# Patient Record
Sex: Male | Born: 1945 | Race: White | Hispanic: No | Smoking: Never smoker
Health system: Southern US, Community
[De-identification: ages and names within clinical notes are randomized; demographics above are authoritative.]

## PROBLEM LIST (undated history)

## (undated) DIAGNOSIS — Z87442 Personal history of urinary calculi: Secondary | ICD-10-CM

## (undated) DIAGNOSIS — R972 Elevated prostate specific antigen [PSA]: Secondary | ICD-10-CM

## (undated) DIAGNOSIS — N529 Male erectile dysfunction, unspecified: Secondary | ICD-10-CM

## (undated) DIAGNOSIS — Z86010 Personal history of colonic polyps: Secondary | ICD-10-CM

## (undated) DIAGNOSIS — M1990 Unspecified osteoarthritis, unspecified site: Secondary | ICD-10-CM

## (undated) DIAGNOSIS — N400 Enlarged prostate without lower urinary tract symptoms: Secondary | ICD-10-CM

## (undated) DIAGNOSIS — Z87448 Personal history of other diseases of urinary system: Secondary | ICD-10-CM

## (undated) DIAGNOSIS — Z8619 Personal history of other infectious and parasitic diseases: Secondary | ICD-10-CM

## (undated) DIAGNOSIS — R399 Unspecified symptoms and signs involving the genitourinary system: Secondary | ICD-10-CM

## (undated) DIAGNOSIS — N486 Induration penis plastica: Secondary | ICD-10-CM

## (undated) DIAGNOSIS — N201 Calculus of ureter: Secondary | ICD-10-CM

## (undated) DIAGNOSIS — Z860101 Personal history of adenomatous and serrated colon polyps: Secondary | ICD-10-CM

## (undated) DIAGNOSIS — N200 Calculus of kidney: Secondary | ICD-10-CM

## (undated) DIAGNOSIS — E291 Testicular hypofunction: Secondary | ICD-10-CM

## (undated) DIAGNOSIS — R7303 Prediabetes: Secondary | ICD-10-CM

## (undated) HISTORY — PX: CATARACT EXTRACTION: SUR2

## (undated) HISTORY — PX: TONSILLECTOMY: SUR1361

## (undated) HISTORY — PX: KIDNEY STONE SURGERY: SHX686

---

## 1999-04-16 HISTORY — PX: INGUINAL HERNIA REPAIR: SUR1180

## 2003-04-16 HISTORY — PX: KNEE ARTHROSCOPY: SUR90

## 2003-05-02 ENCOUNTER — Ambulatory Visit (HOSPITAL_COMMUNITY): Admission: RE | Admit: 2003-05-02 | Discharge: 2003-05-02 | Payer: Self-pay | Admitting: General Surgery

## 2003-10-24 ENCOUNTER — Encounter (INDEPENDENT_AMBULATORY_CARE_PROVIDER_SITE_OTHER): Payer: Self-pay | Admitting: *Deleted

## 2003-10-24 ENCOUNTER — Ambulatory Visit (HOSPITAL_COMMUNITY): Admission: RE | Admit: 2003-10-24 | Discharge: 2003-10-24 | Payer: Self-pay | Admitting: Gastroenterology

## 2008-03-21 DIAGNOSIS — M150 Primary generalized (osteo)arthritis: Secondary | ICD-10-CM | POA: Insufficient documentation

## 2009-01-31 ENCOUNTER — Encounter: Admission: RE | Admit: 2009-01-31 | Discharge: 2009-01-31 | Payer: Self-pay | Admitting: Family Medicine

## 2009-03-16 ENCOUNTER — Ambulatory Visit (HOSPITAL_COMMUNITY): Admission: RE | Admit: 2009-03-16 | Discharge: 2009-03-16 | Payer: Self-pay | Admitting: Gastroenterology

## 2010-08-31 NOTE — Op Note (Signed)
NAME:  Jesus [name], Jesus [name]                           ACCOUNT NO.:  [id number]   MEDICAL RECORD NO.:  [id number]                   PATIENT TYPE:  AMB   LOCATION:  DAY                                  FACILITY:  [facility]   PHYSICIAN:  Valetta Fuller, M.D.               DATE OF BIRTH:  Jan 13, 1946   DATE OF PROCEDURE:  05/02/2003  DATE OF DISCHARGE:                                 OPERATIVE REPORT   PREOPERATIVE DIAGNOSIS:  Peyronie's disease.   POSTOPERATIVE DIAGNOSIS:  Peyronie's disease.   OPERATION/PROCEDURE:  Nesbit plication.   SURGEON:  Valetta Fuller, M.D.   ASSISTANT:  Susanne Borders, M.D.   ANESTHESIA:  General endotracheal anesthesia, penile block.   COMPLICATIONS:  None apparent.   DISPOSITION:  To the PACU in stable condition.   BRIEF INDICATIONS FOR PROCEDURE:  Jesus [name] is a 65 year old male who has  been seen by Dr. Isabel Caprice for his Peyronie's disease.  The patient had a  complaint of curvature which made it difficult for intercourse.  The patient  underwent a prostate gland injection in the office which demonstrated a 90-  degree curvature towards the left and a 30-40% dorsal curvature at about the  mid shaft level.  The patient was told that this problem could be repaired  surgically and he agreed to a Nesbit plication straightening procedure after  understanding the risks, benefits and alternatives.  The patient also had a  right inguinal hernia for which he had seen Dr. Abbey Chatters.  The two  surgeries were scheduled to occur at the same time.   DESCRIPTION OF PROCEDURE:  The patient was found in the operating room  having undergone his right hernia repair by Dr. Abbey Chatters.  His genitalia  had been prepped and draped in typical sterile fashion.  An incision was  made using the scalpel through the penile skin through his old circumcision  scar.  The penile skin was degloved carefully with Metzenbaum scissors to  the proximal corpora.  Small vessels of the dartos tissue  that were  encountered were coagulated with a Bovie electrocautery.  After the penis  was degloved, an artificial erection was created by placing a Penrose at the  base of the penis.  A 23-French butterfly needle was placed at the base of  the penis and normal saline was injected into the corpora to create an  erection.  Again demonstrated was 90-degree curvature towards the left,  approximately 30% dorsal curvature.  The full crown of the curvature was at  the level of the right mid shaft.  Dartos tissue was cleaned off the right  corpora cavernosa at this level.  Two plicating sutures of 2-0 Ethibond were  placed at this level.  Another plicating stitch was placed very close to the  urethra on the left corporal body to help with the dorsal curvature.  Again,  artificial erection was created in the  penis was somewhat straighter but  still has a significant amount of left-sided curvature.  Two more plicating  sutures were placed proximal to the previously placed ones.  Another  plicating stitch was placed in between the first two sutures on the right  side.  Again the sutures were 2-0 Ethibond.  The artificial erection was  repeated once more and the patient had very little curvature.  There was  very minimal dorsal curvature and only approximately 10-20% left-sided  curvature remaining.  The dartos fascia overlying the plicating stitches was  reapproximated with a 4-0 Vicryl on both sides of the corpora.  The penile  skin incision was then closed placing 4-0 Vicryl at 12, 3, 6, and 9 o'clock.  4-0 Vicryl was used to close in a running fashion in between these  interrupted sutures.  A Xeroform and elastic dressing were placed.  Prior to  the patient waking up, a penile block was performed by injecting  approximately 10 mL of 0.5% Marcaine without epinephrine in a ring or NG  fashion.  The patient was then allowed to awaken from his anesthesia and did  so without complications.  He was taken  to post anesthesia care unit in  stable condition having tolerated the procedure well.   Please note that Dr. Isabel Caprice was present and participated in all aspects of  this case as he was primary Careers adviser.     Susanne Borders, MD                           Valetta Fuller, M.D.    DR/MEDQ  D:  05/02/2003  T:  05/02/2003  Job:  [id number]

## 2010-08-31 NOTE — Op Note (Signed)
NAME:  LEVELLE, EDELEN                           ACCOUNT NO.:  [id number]   MEDICAL RECORD NO.:  [id number]                   PATIENT TYPE:  AMB   LOCATION:  DAY                                  FACILITY:  [facility]   PHYSICIAN:  Adolph Pollack, M.D.            DATE OF BIRTH:  1945/09/15   DATE OF PROCEDURE:  05/02/2003  DATE OF DISCHARGE:                                 OPERATIVE REPORT   PREOPERATIVE DIAGNOSIS:  Right inguinal hernia.   POSTOPERATIVE DIAGNOSIS:  Direct right inguinal hernia.   PROCEDURE:  Right inguinal hernia repair with mesh.   SURGEON:  Adolph Pollack, M.D.   ANESTHESIA:  General.   INDICATIONS:  Mr. Mayotte is a 65 year old male who has Peyronie's disease.  He also has a right inguinal hernia.  He is scheduled to have the Peyronie's  disease repaired by Dr. Isabel Caprice, and he also presents for repair of his right  inguinal hernia.  The procedure and the risks as well as the aftercare were  discussed with him preoperatively.   TECHNIQUE:  He is seen in the holding area and the right groin marked with  my initials.  He was then brought to the operating room, placed supine on  the operating table, and a general anesthetic was administered.  The right  groin area, scrotum, and penile areas were shaved, then sterilely prepped  and draped.  Local anesthetic consisting of 0.5% plain Marcaine was  infiltrated both superficially and deep in the right groin area, and a right  groin incision was made through the skin, subcutaneous tissue, and Scarpa's  fascia until the external oblique aponeurosis was identified.  Bleeding  points were controlled with cautery. More local anesthetic was infiltrated  deep to the external oblique aponeurosis.  The external oblique aponeurosis  was then incised and then its fibers split through the external ring  medially and toward the anterior superior iliac spine laterally. Using blunt  dissection the internal oblique muscle and  aponeurosis were exposed  superiorly and the shelving edge of the inguinal ligament was exposed  inferiorly.  I then used blunt dissection to isolate the spermatic cord.  I  noted a direct hernia with some of its contents slightly adherent to the  spermatic cord, and this was able to be separated from the cord bluntly.  The cord was inspected and no indirect defects were noted.   I then placed the patient in Trendelenburg position, reducing the contents  of the hernia back into the extraperitoneal space.  A piece of 3 x 6 inch  polypropylene mesh was then anchored 1 cm medial to the pubic tubercle with  3-0 Prolene suture.  The inferior aspect of the mesh was then anchored to  the shelving edge of the inguinal ligament with a running 2-0 Prolene suture  until this was 1 cm lateral to the internal ring, and then it was tied down.  A slit was cut in the mesh creating two tails, which were wrapped around the  cord.  The superior aspect of the mesh was then anchored to the internal  oblique muscle and aponeurosis with interrupted 2-0 Vicryl sutures.   Before I placed the mesh I did identify the ilioinguinal nerve, and I  anesthetized it with the Marcaine solution.  After anchoring the mesh to the  internal oblique muscle and aponeurosis, which allowed for more than  adequate defect of the direct defect, I then crossed the tails, creating a  new internal ring, and anchored the tails to the shelving edge of the  inguinal ligament with a single 2-0 Prolene suture.  The tip of a hemostat  was able to be fit though the new internal ring aperture.  The cord was  mobile.   At this point I inspected the area and hemostasis was adequate.  The tails  of the mesh were then tucked deep to the external oblique aponeurosis  laterally. The external oblique aponeurosis was then reapproximated with a  running 3-0 Vicryl suture.  Scarpa's fascia was approximated with a running  2-0 Vicryl suture.  The skin  was closed with a 4-0 Monocryl subcuticular  stitch.  Steri-Strips followed by a non-stick dressing were applied.   He tolerated the procedure well without any apparent complications.  Dr.  Isabel Caprice was now going to proceed with repair of the Peyronie's disease.                                               Adolph Pollack, M.D.    Kari Baars  D:  05/02/2003  T:  05/02/2003  Job:  [id number]   cc:   Valetta Fuller, M.D.  [address]  [place]  [place] [address]  Fax: [phone]   Dellis Anes. Idell Pickles, M.D.  [address]  [place]  [place] [address]  Fax: [phone]

## 2010-08-31 NOTE — Op Note (Signed)
NAME:  Jesus [name], Jesus [name]                           ACCOUNT NO.:  [id number]   MEDICAL RECORD NO.:  [id number]                   PATIENT TYPE:  AMB   LOCATION:  ENDO                                 [facility]ACILITY:  [facility]   PHYSICIAN:  Danise Edge, M.D.                DATE OF BIRTH:  1945-10-27   DATE OF PROCEDURE:  10/24/2003  DATE OF DISCHARGE:                                 OPERATIVE REPORT   PROCEDURE:  Screening colonoscopy.   INDICATIONS:  [name] is a 65 year old male born 1945-10-27.  [name] is scheduled to undergo his first screening colonoscopy with  polypectomy to prevent colon cancer.   ENDOSCOPIST:  Charolett Bumpers, M.D.   PREMEDICATION:  1. Versed 6 mg.  2. Demerol 60 mg.   DESCRIPTION OF PROCEDURE:  After obtaining informed consent, [name] was  placed in the left lateral decubitus position.  I administered intravenous  Demerol and intravenous Versed to achieve conscious sedation for the  procedure.  The patient's blood pressure, oxygen saturation and cardiac  rhythm were monitored throughout the procedure and documented in the medical  record.   Anal inspection and digital rectal examination were normal.  The prostate  was not nodular.  The Olympus adjustable pediatric colonoscope was  introduced into the rectum and advanced to the cecum.  Colonic preparation  for the examination today was excellent.   FINDINGS:  RECTUM:  Normal.  SIGMOID COLON:  Normal.  DESCENDING COLON:  Normal.  SPLENIC FLEXURE:  Normal.  TRANSVERSE COLON:  Normal.  HEPATIC FLEXURE:  Normal.  ASCENDING COLON:  Normal.  CECUM AND ILEOCECAL VALVE:  From the distal cecum, a 3 mm sessile polyp was  removed with the electrocautery snare.   ASSESSMENT:  A 3 mm polyp was removed from the distal cecum; otherwise,  normal screening proctocolonoscopy to the cecum.                                               Danise Edge, M.D.    MJ/MEDQ  D:  10/24/2003  T:  10/24/2003  Job:   [id number]   cc:   Dellis Anes. Idell Pickles, M.D.  [address]  [place]  [place] [address]  Fax: [phone]

## 2013-05-01 HISTORY — PX: OTHER SURGICAL HISTORY: SHX169

## 2014-02-13 HISTORY — PX: ABDOMINAL HERNIA REPAIR: SHX539

## 2014-06-02 ENCOUNTER — Ambulatory Visit: Payer: Self-pay | Admitting: Internal Medicine

## 2015-04-18 ENCOUNTER — Other Ambulatory Visit: Payer: Self-pay | Admitting: Gastroenterology

## 2015-05-24 ENCOUNTER — Encounter (HOSPITAL_COMMUNITY): Payer: Self-pay | Admitting: *Deleted

## 2015-05-30 ENCOUNTER — Encounter (HOSPITAL_COMMUNITY)
Admission: RE | Disposition: A | Discharge status: Home / Self Care | Payer: Self-pay | Source: Ambulatory Visit | Attending: Gastroenterology

## 2015-05-30 SURGERY — COLONOSCOPY WITH PROPOFOL
Anesthesia: Monitor Anesthesia Care

## 2015-06-06 ENCOUNTER — Ambulatory Visit (HOSPITAL_COMMUNITY)
Admission: RE | Admit: 2015-06-06 | Discharge: 2015-06-06 | Disposition: A | Discharge status: Home / Self Care | Payer: Federal, State, Local not specified - PPO | Source: Ambulatory Visit | Attending: Gastroenterology | Admitting: Gastroenterology

## 2015-06-06 ENCOUNTER — Ambulatory Visit (HOSPITAL_COMMUNITY): Payer: Federal, State, Local not specified - PPO | Admitting: Anesthesiology

## 2015-06-06 ENCOUNTER — Encounter (HOSPITAL_COMMUNITY)
Admission: RE | Disposition: A | Discharge status: Home / Self Care | Payer: Self-pay | Source: Ambulatory Visit | Attending: Gastroenterology

## 2015-06-06 ENCOUNTER — Encounter (HOSPITAL_COMMUNITY): Payer: Self-pay

## 2015-06-06 DIAGNOSIS — Z86010 Personal history of colonic polyps: Secondary | ICD-10-CM | POA: Insufficient documentation

## 2015-06-06 DIAGNOSIS — D1779 Benign lipomatous neoplasm of other sites: Secondary | ICD-10-CM | POA: Insufficient documentation

## 2015-06-06 DIAGNOSIS — Z1211 Encounter for screening for malignant neoplasm of colon: Secondary | ICD-10-CM | POA: Diagnosis not present

## 2015-06-06 DIAGNOSIS — M1990 Unspecified osteoarthritis, unspecified site: Secondary | ICD-10-CM | POA: Diagnosis not present

## 2015-06-06 HISTORY — DX: Unspecified osteoarthritis, unspecified site: M19.90

## 2015-06-06 HISTORY — PX: COLONOSCOPY WITH PROPOFOL: SHX5780

## 2015-06-06 SURGERY — COLONOSCOPY WITH PROPOFOL
Anesthesia: Monitor Anesthesia Care

## 2015-06-06 MED ORDER — SODIUM CHLORIDE 0.9 % IV SOLN: INTRAVENOUS | Status: DC

## 2015-06-06 MED ORDER — PROPOFOL 500 MG/50ML IV EMUL
INTRAVENOUS | Status: DC | PRN
  Administered 2015-06-06: 150 ug/kg/min via INTRAVENOUS

## 2015-06-06 MED ORDER — LACTATED RINGERS IV SOLN
INTRAVENOUS | Status: DC
  Administered 2015-06-06: 10:00:00 via INTRAVENOUS

## 2015-06-06 MED ORDER — PROPOFOL 10 MG/ML IV BOLUS
INTRAVENOUS | Status: DC | PRN
  Administered 2015-06-06: 80 mg via INTRAVENOUS

## 2015-06-06 MED ORDER — LIDOCAINE HCL (CARDIAC) 20 MG/ML IV SOLN
INTRAVENOUS | Status: DC | PRN
  Administered 2015-06-06: 50 mg via INTRAVENOUS

## 2015-06-06 MED ORDER — PROPOFOL 10 MG/ML IV BOLUS
INTRAVENOUS | Status: AC
  Filled 2015-06-06: qty 40

## 2015-06-06 SURGICAL SUPPLY — 21 items

## 2015-06-06 NOTE — Anesthesia Preprocedure Evaluation (Signed)
Anesthesia Evaluation  Patient identified by MRN, date of birth, ID band Patient awake    Reviewed: Allergy & Precautions, NPO status , Patient's Chart, lab work & pertinent test results  Airway Mallampati: II   Neck ROM: full    Dental   Pulmonary neg pulmonary ROS,    breath sounds clear to auscultation       Cardiovascular negative cardio ROS   Rhythm:regular Rate:Normal     Neuro/Psych    GI/Hepatic   Endo/Other    Renal/GU      Musculoskeletal  (+) Arthritis ,   Abdominal   Peds  Hematology   Anesthesia Other Findings   Reproductive/Obstetrics                             Anesthesia Physical Anesthesia Plan  ASA: II  Anesthesia Plan: MAC   Post-op Pain Management:    Induction: Intravenous  Airway Management Planned: Simple Face Mask  Additional Equipment:   Intra-op Plan:   Post-operative Plan:   Informed Consent: I have reviewed the patients History and Physical, chart, labs and discussed the procedure including the risks, benefits and alternatives for the proposed anesthesia with the patient or authorized representative who has indicated his/her understanding and acceptance.     Plan Discussed with: CRNA, Anesthesiologist and Surgeon  Anesthesia Plan Comments:         Anesthesia Quick Evaluation

## 2015-06-06 NOTE — Anesthesia Postprocedure Evaluation (Signed)
Anesthesia Post Note  Patient: Jesus [name]  Procedure(s) Performed: Procedure(s) (LRB): COLONOSCOPY WITH PROPOFOL (N/A)  Patient location during evaluation: PACU Anesthesia Type: MAC Level of consciousness: awake and alert Pain management: pain level controlled Vital Signs Assessment: post-procedure vital signs reviewed and stable Respiratory status: spontaneous breathing, nonlabored ventilation, respiratory function stable and patient connected to nasal cannula oxygen Cardiovascular status: stable and blood pressure returned to baseline Anesthetic complications: no    Last Vitals:  Filed Vitals:   06/06/15 1120 06/06/15 1125  BP: 113/71   Pulse: 58 54  Temp:    Resp: 15 17    Last Pain: There were no vitals filed for this visit.               [place]

## 2015-06-06 NOTE — Discharge Instructions (Signed)

## 2015-06-06 NOTE — H&P (Signed)
  Procedure: Surveillance colonoscopy. 2005 colonoscopy was performed with removal of a tubulovillous adenomatous colon polyp. 03/16/2009 normal surveillance colonoscopy was performed  History: The patient is a 70 year old male born 05/25/1945. He is scheduled to undergo a surveillance colonoscopy today  Past medical history: Left knee surgery. Retinal laser surgery. Hernia surgery. Tonsillectomy. Allergic rhinitis.  Exam: The patient is alert and lying comfortably on the endoscopy stretcher. Abdomen is soft and nontender to palpation. Lungs are clear to auscultation. Cardiac exam reveals a regular rhythm.  Plan: Proceed with surveillance colonoscopy

## 2015-06-06 NOTE — Transfer of Care (Signed)
Immediate Anesthesia Transfer of Care Note  Patient: Jesus [name]  Procedure(s) Performed: Procedure(s): COLONOSCOPY WITH PROPOFOL (N/A)  Patient Location: PACU  Anesthesia Type:MAC  Level of Consciousness: awake, alert  and oriented  Airway & Oxygen Therapy: Patient Spontanous Breathing and Patient connected to face mask oxygen  Post-op Assessment: Report given to RN and Post -op Vital signs reviewed and stable  Post vital signs: Reviewed and stable  Last Vitals:  Filed Vitals:   06/06/15 0936  BP: 133/79  Pulse: 71  Temp: 36.7 C  Resp: 10    Complications: No apparent anesthesia complications

## 2015-06-06 NOTE — Op Note (Signed)
Procedure: Surveillance colonoscopy. 2005 colonoscopy was performed with removal of a tubulovillous adenomatous colon polyp. 03/16/2009 normal surveillance colonoscopy was performed  Endoscopist: Earle Gell  Premedication: Propofol administered by anesthesia  Procedure: The patient was placed in the left lateral decubitus position. Anal inspection and digital rectal exam were normal. The Pentax pediatric colonoscope was introduced into the rectum and advanced to the cecum. A normal-appearing ileocecal valve and appendiceal orifice were identified. Colonic preparation for the exam today was good. Withdrawal time was 22 minutes  Rectum. Normal. Retroflexed view of the distal rectum was normal  Sigmoid colon and descending colon. A 6 mm pedunculated polyp was removed from the mid sigmoid colon. An Endo Clip was applied to the polyp base and the cold snare was used to remove the polyp.  Splenic flexure. Normal  Transverse colon. Normal  Hepatic flexure. Normal  Ascending colon. Normal  Cecum and ileocecal valve. Normal  Assessment: A small pedunculated polyp was removed from the sigmoid colon. Otherwise normal surveillance colonoscopy  Recommendation: I will review the colon polyp pathology to determine if the patient should undergo a repeat colonoscopy in the future.

## 2015-06-07 ENCOUNTER — Encounter (HOSPITAL_COMMUNITY): Payer: Self-pay | Admitting: Gastroenterology

## 2015-10-13 LAB — PSA: PSA: 2.11

## 2015-11-27 ENCOUNTER — Emergency Department (HOSPITAL_COMMUNITY): Payer: Federal, State, Local not specified - PPO

## 2015-11-27 ENCOUNTER — Emergency Department (HOSPITAL_COMMUNITY)
Admission: EM | Admit: 2015-11-27 | Discharge: 2015-11-27 | Disposition: A | Discharge status: Home / Self Care | Payer: Federal, State, Local not specified - PPO | Attending: Emergency Medicine | Admitting: Emergency Medicine

## 2015-11-27 ENCOUNTER — Encounter (HOSPITAL_COMMUNITY): Payer: Self-pay | Admitting: Emergency Medicine

## 2015-11-27 DIAGNOSIS — N23 Unspecified renal colic: Secondary | ICD-10-CM

## 2015-11-27 DIAGNOSIS — R109 Unspecified abdominal pain: Secondary | ICD-10-CM | POA: Insufficient documentation

## 2015-11-27 DIAGNOSIS — R7989 Other specified abnormal findings of blood chemistry: Secondary | ICD-10-CM | POA: Insufficient documentation

## 2015-11-27 DIAGNOSIS — M545 Low back pain: Secondary | ICD-10-CM | POA: Diagnosis present

## 2015-11-27 DIAGNOSIS — Z79899 Other long term (current) drug therapy: Secondary | ICD-10-CM | POA: Diagnosis not present

## 2015-11-27 DIAGNOSIS — N200 Calculus of kidney: Secondary | ICD-10-CM | POA: Insufficient documentation

## 2015-11-27 DIAGNOSIS — K8020 Calculus of gallbladder without cholecystitis without obstruction: Secondary | ICD-10-CM | POA: Insufficient documentation

## 2015-11-27 LAB — URINE MICROSCOPIC-ADD ON
Bacteria, UA: NONE SEEN
SQUAMOUS EPITHELIAL / LPF: NONE SEEN
WBC UA: NONE SEEN WBC/hpf (ref 0–5)

## 2015-11-27 LAB — URINALYSIS, ROUTINE W REFLEX MICROSCOPIC
BILIRUBIN URINE: NEGATIVE
Glucose, UA: NEGATIVE mg/dL
KETONES UR: NEGATIVE mg/dL
LEUKOCYTES UA: NEGATIVE
NITRITE: NEGATIVE
PH: 7.5 (ref 5.0–8.0)
PROTEIN: NEGATIVE mg/dL
Specific Gravity, Urine: 1.019 (ref 1.005–1.030)

## 2015-11-27 LAB — COMPREHENSIVE METABOLIC PANEL
ALBUMIN: 3.6 g/dL (ref 3.5–5.0)
ALK PHOS: 44 U/L (ref 38–126)
ALT: 19 U/L (ref 17–63)
AST: 25 U/L (ref 15–41)
Anion gap: 9 (ref 5–15)
BILIRUBIN TOTAL: 0.6 mg/dL (ref 0.3–1.2)
BUN: 19 mg/dL (ref 6–20)
CALCIUM: 9.1 mg/dL (ref 8.9–10.3)
CO2: 28 mmol/L (ref 22–32)
CREATININE: 1.48 mg/dL — AB (ref 0.61–1.24)
Chloride: 103 mmol/L (ref 101–111)
GFR calc Af Amer: 53 mL/min — ABNORMAL LOW (ref >60)
GFR calc non Af Amer: 46 mL/min — ABNORMAL LOW (ref >60)
GLUCOSE: 137 mg/dL — AB (ref 65–99)
POTASSIUM: 4.4 mmol/L (ref 3.5–5.1)
Sodium: 140 mmol/L (ref 135–145)
TOTAL PROTEIN: 6.1 g/dL — AB (ref 6.5–8.1)

## 2015-11-27 LAB — CBC WITH DIFFERENTIAL/PLATELET
BASOS ABS: 0 10*3/uL (ref 0.0–0.1)
BASOS PCT: 0 %
EOS ABS: 0.4 10*3/uL (ref 0.0–0.7)
EOS PCT: 4 %
HCT: 45.1 % (ref 39.0–52.0)
Hemoglobin: 14.9 g/dL (ref 13.0–17.0)
Lymphocytes Relative: 21 %
Lymphs Abs: 2.3 10*3/uL (ref 0.7–4.0)
MCH: 31.8 pg (ref 26.0–34.0)
MCHC: 33 g/dL (ref 30.0–36.0)
MCV: 96.4 fL (ref 78.0–100.0)
MONO ABS: 0.7 10*3/uL (ref 0.1–1.0)
MONOS PCT: 7 %
Neutro Abs: 7.6 10*3/uL (ref 1.7–7.7)
Neutrophils Relative %: 68 %
PLATELETS: 211 10*3/uL (ref 150–400)
RBC: 4.68 MIL/uL (ref 4.22–5.81)
RDW: 13.9 % (ref 11.5–15.5)
WBC: 11.1 10*3/uL — ABNORMAL HIGH (ref 4.0–10.5)

## 2015-11-27 LAB — LIPASE, BLOOD: Lipase: 40 U/L (ref 11–51)

## 2015-11-27 MED ORDER — HYDROMORPHONE HCL 1 MG/ML IJ SOLN
1.0000 mg | Freq: Once | INTRAMUSCULAR | Status: AC
  Administered 2015-11-27: 1 mg via INTRAVENOUS
  Filled 2015-11-27 (×2): qty 1

## 2015-11-27 MED ORDER — OXYCODONE-ACETAMINOPHEN 5-325 MG PO TABS: 1.0000 | ORAL_TABLET | ORAL | 0 refills | Status: DC | PRN

## 2015-11-27 MED ORDER — ONDANSETRON HCL 4 MG PO TABS: 4.0000 mg | ORAL_TABLET | Freq: Three times a day (TID) | ORAL | 0 refills | Status: DC | PRN

## 2015-11-27 MED ORDER — ONDANSETRON HCL 4 MG/2ML IJ SOLN
4.0000 mg | Freq: Once | INTRAMUSCULAR | Status: AC
  Administered 2015-11-27: 4 mg via INTRAVENOUS
  Filled 2015-11-27 (×2): qty 2

## 2015-11-27 NOTE — ED Triage Notes (Signed)
Pt reports left sided back pain that started around 0400. Nausea and sweating with constipation. Pt seems distressed and reports getting over a cold

## 2015-11-27 NOTE — ED Provider Notes (Signed)
[facility] DEPT Provider Note   CSN: [id number] Arrival date & time: 11/27/15  [id number]  First Provider Contact:  First MD Initiated Contact with Patient 11/27/15 [phone]        History   Chief Complaint Chief Complaint  Patient presents with  . Back Pain  . Nausea    HPI Jesus [name] is a 70 y.o. male who presents to the ED to the chief complaint of right lower back pain. The patient awoke well for him this morning feeling like he needed to defecate. Sudden onset right flank pain which progressively worsened, was severe, he was retching, sweating, extremely nauseous. He had feelings as if he needed to defecate. He denies any urinary symptoms, fever. He has never had any symptoms like that before. His symptoms are currently resolved. The patient also states that he has had some constipation and has been taking stool softeners. He has not had a cathartic bowel movement every 3 days. He states he's had only small hard stool balls. He has a history of previous abdominal surgery, knee surgeries including 2. Inguinal and 1 ventral hernia repair and a previous small bowel obstruction requiring surgical intervention.  Denies fevers, chills, myalgias, arthralgias. Denies DOE, SOB, chest tightness or pressure, radiation to left arm, jaw or back, or diaphoresis.  Denies headaches, light headedness, weakness, visual disturbances.  HPI  Past Medical History:  Diagnosis Date  . Arthritis   . H/O seasonal allergies     There are no active problems to display for this patient.   Past Surgical History:  Procedure Laterality Date  . COLONOSCOPY WITH PROPOFOL N/A 06/06/2015   Procedure: COLONOSCOPY WITH PROPOFOL;  Surgeon: [name], MD;  Location: [facility];  Service: Endoscopy;  Laterality: N/A;  . HERNIA REPAIR  03/2014   abdominal hernia  . HERNIA REPAIR  [id number]  . KNEE SURGERY    . TONSILLECTOMY         Home Medications    Prior to Admission medications   Medication Sig  Start Date End Date Taking? Authorizing Provider  Cholecalciferol (VITAMIN D-3 PO) Take 1 tablet by mouth every morning.   Yes Historical Provider, MD  Cyanocobalamin (VITAMIN B-12 PO) Take 1 tablet by mouth daily.   Yes Historical Provider, MD  ferrous sulfate 325 (65 FE) MG tablet Take 325 mg by mouth daily with breakfast.   Yes Historical Provider, MD  fexofenadine (ALLEGRA) 180 MG tablet Take 180 mg by mouth 2 (two) times daily. Jan- March.   Yes Historical Provider, MD  meloxicam (MOBIC) 15 MG tablet Take 15 mg by mouth daily as needed for pain.   Yes Historical Provider, MD  Multiple Vitamin (MULTIVITAMIN WITH MINERALS) TABS tablet Take 1 tablet by mouth daily.   Yes Historical Provider, MD  Omega-3 Fatty Acids (OMEGA 3 PO) Take 1 tablet by mouth every morning.   Yes Historical Provider, MD  tamsulosin (FLOMAX) 0.4 MG CAPS capsule Take 0.4 mg by mouth every morning. 05/13/15  Yes Historical Provider, MD  Testosterone 10 MG/ACT (2%) GEL APPLY 4 PUMPS TO SHOULDERS AND CHEST AREA 04/28/15  Yes Historical Provider, MD  ondansetron (ZOFRAN) 4 MG tablet Take 1 tablet (4 mg total) by mouth every 8 (eight) hours as needed for nausea or vomiting. 11/27/15   Margarita Mail, PA-C  oxyCODONE-acetaminophen (PERCOCET) 5-325 MG tablet Take 1-2 tablets by mouth every 4 (four) hours as needed. 11/27/15   Margarita Mail, PA-C    Family History No family history on file.  Social History Social History  Substance Use Topics  . Smoking status: Never Smoker  . Smokeless tobacco: Not on file  . Alcohol use Yes     Allergies   Review of patient's allergies indicates no known allergies.   Review of Systems Review of Systems Ten systems reviewed and are negative for acute change, except as noted in the HPI.    Physical Exam Updated Vital Signs BP 120/62   Pulse (!) 56   Temp 99.4 F (37.4 C) (Oral)   Resp 11   SpO2 96%   Physical Exam Physical Exam  Nursing note and vitals  reviewed. Constitutional: He appears well-developed and well-nourished. No distress.  HENT:  Head: Normocephalic and atraumatic.  Eyes: Conjunctivae normal are normal. No scleral icterus.  Neck: Normal range of motion. Neck supple.  Cardiovascular: Normal rate, regular rhythm and normal heart sounds.   Pulmonary/Chest: Effort normal and breath sounds normal. No respiratory distress.  Abdominal: Soft. There is no tenderness.  Musculoskeletal: He exhibits no edema.  Neurological: He is alert.  Skin: Skin is warm and dry. He is not diaphoretic.  Psychiatric: His behavior is normal.     ED Treatments / Results  Labs (all labs ordered are listed, but only abnormal results are displayed) Labs Reviewed  COMPREHENSIVE METABOLIC PANEL - Abnormal; Notable for the following:       Result Value   Glucose, Bld 137 (*)    Creatinine, Ser 1.48 (*)    Total Protein 6.1 (*)    GFR calc non Af Amer 46 (*)    GFR calc Af Amer 53 (*)    All other components within normal limits  CBC WITH DIFFERENTIAL/PLATELET - Abnormal; Notable for the following:    WBC 11.1 (*)    All other components within normal limits  URINALYSIS, ROUTINE W REFLEX MICROSCOPIC (NOT AT [facility]) - Abnormal; Notable for the following:    APPearance CLOUDY (*)    Hgb urine dipstick LARGE (*)    All other components within normal limits  LIPASE, BLOOD  URINE MICROSCOPIC-ADD ON    EKG  EKG Interpretation None       Radiology Ct Renal Stone Study  Result Date: 11/27/2015 CLINICAL DATA:  Right-sided flank pain for several hours EXAM: CT ABDOMEN AND PELVIS WITHOUT CONTRAST TECHNIQUE: Multidetector CT imaging of the abdomen and pelvis was performed following the standard protocol without IV contrast. COMPARISON:  01/31/2009 FINDINGS: Lower chest: Mild atelectatic changes are noted in the left lower lobe posteriorly. Hepatobiliary: Gallbladder is well distended with multiple gallstones. No gallbladder wall thickening or  pericholecystic fluid is noted. Cysts are again noted within the liver but increased in size when compared with the prior exam. The largest of these lies in the right lobe measuring 5.3 cm. Pancreas: No mass or inflammatory process identified on this un-enhanced exam. Spleen: Within normal limits in size. Adrenals/Urinary Tract: The adrenal glands are within normal limits. Scarring and cortical calcifications are again seen in the lower pole on the left. A 5 mm proximal right ureteral stone is noted with mild hydronephrosis. Scattered small less than 5 mm nonobstructing stones are noted on the right. These are new from the prior exam. The more distal right ureter is within normal limits. The bladder is partially distended. Stomach/Bowel: No evidence of obstruction, inflammatory process, or abnormal fluid collections. The appendix is within normal limits. Vascular/Lymphatic: No pathologically enlarged lymph nodes. No evidence of abdominal aortic aneurysm. Reproductive: No mass or other significant abnormality. Other:  None. Musculoskeletal: Degenerative changes are noted within the lumbar spine. IMPRESSION: 5 mm obstructing stone in the proximal right ureter. Scattered small nonobstructing right renal stones are noted. Cholelithiasis without complicating factors. Other chronic changes stable from the previous exam. Electronically Signed   By: Inez Catalina M.D.   On: 11/27/2015 09:09    Procedures Procedures (including critical care time)  Medications Ordered in ED Medications  ondansetron (ZOFRAN) injection 4 mg (4 mg Intravenous Given 11/27/15 1015)  HYDROmorphone (DILAUDID) injection 1 mg (1 mg Intravenous Given 11/27/15 1015)     Initial Impression / Assessment and Plan / ED Course  I have reviewed the triage vital signs and the nursing notes.  Pertinent labs & imaging results that were available during my care of the patient were reviewed by me and considered in my medical decision making (see chart  for details).  Clinical Course  Comment By Time  Patient  with 5 mm proximal without hydronephrosis.   Margarita Mail, PA-C 08/14 [id number]   I spoke with the urologist on call, Dr.Budzyn. Patient's creatinine is slightly elevated with a very proximal stone. He is pain controlled here in the emergency department. Feel he can go home with strong return precautions. He will need to follow-up with Dr. Pearline Cables, PA-C, who is his reactive urologist for repeat creatinine. I have discussed reasons to seek immediate medical care at the emergency department. Patient be discharged with Percocet, Zofran. He has Flomax at home and can continue to take it. Pierce safe for discharge at this time. UA is not concerning for infection. No signs of pyelonephritis.   Final Clinical Impressions(s) / ED Diagnoses   Final diagnoses:  Ureteral colic  Kidney stones  Calculus of gallbladder without cholecystitis without obstruction  Elevated serum creatinine    New Prescriptions Discharge Medication List as of 11/27/2015 11:25 AM    START taking these medications   Details  ondansetron (ZOFRAN) 4 MG tablet Take 1 tablet (4 mg total) by mouth every 8 (eight) hours as needed for nausea or vomiting., Starting Mon 11/27/2015, Print    oxyCODONE-acetaminophen (PERCOCET) 5-325 MG tablet Take 1-2 tablets by mouth every 4 (four) hours as needed., Starting Mon 11/27/2015, Print         [place], PA-C 11/27/15 1616    Merryl Hacker, MD 11/28/15 0710

## 2015-11-27 NOTE — ED Notes (Signed)
PA at bedside.

## 2015-11-27 NOTE — ED Notes (Signed)
Patient given cup for urine sample.

## 2015-11-27 NOTE — ED Notes (Signed)
Patient returned to room placed back on monitor.  Patient has no complaints at this time. Patient denies pain.

## 2015-11-27 NOTE — Discharge Instructions (Signed)
Return to the ED immediately if you develop fever, uncontrolled pain or vomiting, or other concerns. Follow up with Dr. Risa Grill to have your creatinine rechecked.

## 2015-11-27 NOTE — ED Notes (Signed)
Pt decline pain and nausea medication at this time

## 2015-11-30 ENCOUNTER — Ambulatory Visit (HOSPITAL_BASED_OUTPATIENT_CLINIC_OR_DEPARTMENT_OTHER)
Admission: RE | Admit: 2015-11-30 | Discharge: 2015-11-30 | Disposition: A | Discharge status: Home / Self Care | Payer: Federal, State, Local not specified - PPO | Source: Ambulatory Visit | Attending: Urology | Admitting: Urology

## 2015-11-30 ENCOUNTER — Encounter (HOSPITAL_BASED_OUTPATIENT_CLINIC_OR_DEPARTMENT_OTHER): Payer: Self-pay | Admitting: Anesthesiology

## 2015-11-30 ENCOUNTER — Ambulatory Visit (HOSPITAL_BASED_OUTPATIENT_CLINIC_OR_DEPARTMENT_OTHER): Payer: Federal, State, Local not specified - PPO | Admitting: Anesthesiology

## 2015-11-30 ENCOUNTER — Other Ambulatory Visit: Payer: Self-pay | Admitting: Urology

## 2015-11-30 ENCOUNTER — Encounter (HOSPITAL_BASED_OUTPATIENT_CLINIC_OR_DEPARTMENT_OTHER)
Admission: RE | Disposition: A | Discharge status: Home / Self Care | Payer: Self-pay | Source: Ambulatory Visit | Attending: Urology

## 2015-11-30 DIAGNOSIS — T402X5A Adverse effect of other opioids, initial encounter: Secondary | ICD-10-CM | POA: Diagnosis not present

## 2015-11-30 DIAGNOSIS — N179 Acute kidney failure, unspecified: Secondary | ICD-10-CM | POA: Insufficient documentation

## 2015-11-30 DIAGNOSIS — Z79899 Other long term (current) drug therapy: Secondary | ICD-10-CM | POA: Diagnosis not present

## 2015-11-30 DIAGNOSIS — N132 Hydronephrosis with renal and ureteral calculous obstruction: Secondary | ICD-10-CM | POA: Diagnosis not present

## 2015-11-30 DIAGNOSIS — K5903 Drug induced constipation: Secondary | ICD-10-CM | POA: Insufficient documentation

## 2015-11-30 HISTORY — PX: CYSTOSCOPY WITH RETROGRADE PYELOGRAM, URETEROSCOPY AND STENT PLACEMENT: SHX5789

## 2015-11-30 LAB — POCT I-STAT 4, (NA,K, GLUC, HGB,HCT)
GLUCOSE: 118 mg/dL — AB (ref 65–99)
HEMATOCRIT: 38 % — AB (ref 39.0–52.0)
HEMOGLOBIN: 12.9 g/dL — AB (ref 13.0–17.0)
POTASSIUM: 4.4 mmol/L (ref 3.5–5.1)
Sodium: 136 mmol/L (ref 135–145)

## 2015-11-30 SURGERY — CYSTOURETEROSCOPY, WITH RETROGRADE PYELOGRAM AND STENT INSERTION
Anesthesia: General | Site: Ureter | Laterality: Right

## 2015-11-30 MED ORDER — CEFAZOLIN SODIUM-DEXTROSE 2-4 GM/100ML-% IV SOLN
INTRAVENOUS | Status: AC
Start: 2015-11-30 — End: 2015-11-30
  Filled 2015-11-30: qty 100

## 2015-11-30 MED ORDER — EPHEDRINE SULFATE 50 MG/ML IJ SOLN
INTRAMUSCULAR | Status: DC | PRN
  Administered 2015-11-30 (×2): 10 mg via INTRAVENOUS

## 2015-11-30 MED ORDER — STERILE WATER FOR IRRIGATION IR SOLN
Status: DC | PRN
  Administered 2015-11-30: 500 mL

## 2015-11-30 MED ORDER — FENTANYL CITRATE (PF) 100 MCG/2ML IJ SOLN
INTRAMUSCULAR | Status: AC
  Filled 2015-11-30: qty 2

## 2015-11-30 MED ORDER — MIDAZOLAM HCL 5 MG/5ML IJ SOLN
INTRAMUSCULAR | Status: DC | PRN
  Administered 2015-11-30: 2 mg via INTRAVENOUS

## 2015-11-30 MED ORDER — LIDOCAINE HCL (CARDIAC) 20 MG/ML IV SOLN
INTRAVENOUS | Status: DC | PRN
  Administered 2015-11-30: 80 mg via INTRAVENOUS

## 2015-11-30 MED ORDER — KETOROLAC TROMETHAMINE 30 MG/ML IJ SOLN
INTRAMUSCULAR | Status: AC
  Filled 2015-11-30: qty 1

## 2015-11-30 MED ORDER — IOHEXOL 300 MG/ML  SOLN
INTRAMUSCULAR | Status: DC | PRN
  Administered 2015-11-30: 10 mL

## 2015-11-30 MED ORDER — OXYCODONE-ACETAMINOPHEN 5-325 MG PO TABS: 1.0000 | ORAL_TABLET | ORAL | 0 refills | Status: DC | PRN

## 2015-11-30 MED ORDER — DEXAMETHASONE SODIUM PHOSPHATE 10 MG/ML IJ SOLN
INTRAMUSCULAR | Status: DC | PRN
  Administered 2015-11-30: 10 mg via INTRAVENOUS

## 2015-11-30 MED ORDER — CEFAZOLIN SODIUM-DEXTROSE 2-4 GM/100ML-% IV SOLN
2.0000 g | INTRAVENOUS | Status: AC
  Administered 2015-11-30: 2 g via INTRAVENOUS
  Filled 2015-11-30: qty 100

## 2015-11-30 MED ORDER — MIDAZOLAM HCL 2 MG/2ML IJ SOLN
INTRAMUSCULAR | Status: AC
Start: 2015-11-30 — End: 2015-11-30
  Filled 2015-11-30: qty 2

## 2015-11-30 MED ORDER — LACTATED RINGERS IV SOLN
INTRAVENOUS | Status: DC
  Administered 2015-11-30: 11:00:00 via INTRAVENOUS
  Filled 2015-11-30: qty 1000

## 2015-11-30 MED ORDER — SODIUM CHLORIDE 0.9 % IR SOLN
Status: DC | PRN
  Administered 2015-11-30: 4000 mL

## 2015-11-30 MED ORDER — KETOROLAC TROMETHAMINE 15 MG/ML IJ SOLN
INTRAMUSCULAR | Status: DC | PRN
  Administered 2015-11-30: 15 mg via INTRAVENOUS

## 2015-11-30 MED ORDER — ONDANSETRON HCL 4 MG/2ML IJ SOLN
INTRAMUSCULAR | Status: DC | PRN
  Administered 2015-11-30: 4 mg via INTRAVENOUS

## 2015-11-30 MED ORDER — FENTANYL CITRATE (PF) 100 MCG/2ML IJ SOLN
INTRAMUSCULAR | Status: DC | PRN
  Administered 2015-11-30: 25 ug via INTRAVENOUS
  Administered 2015-11-30 (×2): 50 ug via INTRAVENOUS

## 2015-11-30 MED ORDER — LIDOCAINE HCL (CARDIAC) 20 MG/ML IV SOLN
INTRAVENOUS | Status: AC
  Filled 2015-11-30: qty 5

## 2015-11-30 MED ORDER — CEFAZOLIN IN D5W 1 GM/50ML IV SOLN
1.0000 g | INTRAVENOUS | Status: DC
  Filled 2015-11-30: qty 50

## 2015-11-30 MED ORDER — PROPOFOL 500 MG/50ML IV EMUL
INTRAVENOUS | Status: DC | PRN
  Administered 2015-11-30: 150 mL via INTRAVENOUS

## 2015-11-30 SURGICAL SUPPLY — 33 items
BAG DRAIN URO-CYSTO SKYTR STRL (DRAIN) ×3 IMPLANT
BASKET LASER NITINOL 1.9FR (BASKET) IMPLANT
BASKET STNLS GEMINI 4WIRE 3FR (BASKET) IMPLANT
BASKET ZERO TIP NITINOL 2.4FR (BASKET) IMPLANT
CATH INTERMIT  6FR 70CM (CATHETERS) ×3 IMPLANT
CATH URET 5FR 28IN CONE TIP (BALLOONS) ×1
CATH URET 5FR 28IN OPEN ENDED (CATHETERS) IMPLANT
CATH URET 5FR 70CM CONE TIP (BALLOONS) ×2 IMPLANT
CATH URET DUAL LUMEN 6-10FR 50 (CATHETERS) IMPLANT
CLOTH BEACON ORANGE TIMEOUT ST (SAFETY) ×3 IMPLANT
GLOVE BIO SURGEON STRL SZ7.5 (GLOVE) ×3 IMPLANT
GLOVE ECLIPSE 7.0 STRL STRAW (GLOVE) ×3 IMPLANT
GLOVE INDICATOR 7.0 STRL GRN (GLOVE) ×3 IMPLANT
GLOVE INDICATOR 7.5 STRL GRN (GLOVE) ×6 IMPLANT
GOWN STRL REUS W/ TWL LRG LVL3 (GOWN DISPOSABLE) ×2 IMPLANT
GOWN STRL REUS W/ TWL XL LVL3 (GOWN DISPOSABLE) ×2 IMPLANT
GOWN STRL REUS W/TWL LRG LVL3 (GOWN DISPOSABLE) ×7 IMPLANT
GOWN STRL REUS W/TWL XL LVL3 (GOWN DISPOSABLE) ×1
GUIDEWIRE 0.038 PTFE COATED (WIRE) IMPLANT
GUIDEWIRE ANG ZIPWIRE 038X150 (WIRE) IMPLANT
GUIDEWIRE STR DUAL SENSOR (WIRE) ×3 IMPLANT
IV NS IRRIG 3000ML ARTHROMATIC (IV SOLUTION) ×3 IMPLANT
KIT BALLIN UROMAX 15FX10 (LABEL) IMPLANT
KIT BALLN UROMAX 15FX4 (MISCELLANEOUS) IMPLANT
KIT BALLN UROMAX 26 75X4 (MISCELLANEOUS)
KIT ROOM TURNOVER WOR (KITS) ×3 IMPLANT
MANIFOLD NEPTUNE II (INSTRUMENTS) IMPLANT
PACK CYSTO (CUSTOM PROCEDURE TRAY) ×3 IMPLANT
SET HIGH PRES BAL DIL (LABEL)
SHEATH ACCESS URETERAL 38CM (SHEATH) IMPLANT
STENT URET 6FRX26 CONTOUR (STENTS) ×3 IMPLANT
SYRINGE IRR TOOMEY STRL 70CC (SYRINGE) ×3 IMPLANT
TUBE CONNECTING 12X1/4 (SUCTIONS) IMPLANT

## 2015-11-30 NOTE — Interval H&P Note (Signed)
History and Physical Interval Note:  11/30/2015 1:19 PM  Jesus [name]  has presented today for surgery, with the diagnosis of ELEVATED CREATININE, RIGHT MID URETERAL CALCULUS  The various methods of treatment have been discussed with the patient and family. After consideration of risks, benefits and other options for treatment, the patient has consented to  Procedure(s): CYSTOSCOPY/RETROGRADE/URETEROSCOPY/STONE EXTRACTION WITH BASKET, DOUBLE J STENT (Right) as a surgical intervention .  The patient's history has been reviewed, patient examined, no change in status, stable for surgery.  Discussed he may need a pre-stent/staged procedure. I have reviewed the patient's chart and labs.  Questions were answered to the patient's satisfaction.  He has been well. No fever or dysuria.    Korrin Waterfield

## 2015-11-30 NOTE — H&P (Signed)
Office Visit Report     11/30/2015   --------------------------------------------------------------------------------   Jesus [name]  MRN: [id number]  PRIMARY CARE:  Jesus Gros L. Jonni Sanger, MD  DOB: 11-15-1945, 70 year old Male  REFERRING:    SSN: [id number]  PROVIDER:  Rana [name], M.D.    TREATING:  Jesus [name]    LOCATION:  [facility] [phone]   --------------------------------------------------------------------------------   CC: I have ureteral stone.  HPI: Jesus [name] is a 70 year-old male established patient who is here for ureteral stone.  Pain yesterday was nonexistance but today has again flared back up. Is having severe constipation with PRN pain med.   Previous SCr 1.48   The problem is on the right side. He first stated noticing pain on 11/27/2015. This is his first kidney stone. He is currently having flank pain and back pain. He denies having groin pain, nausea, vomiting, fever, and chills. Pain is occuring on the right side. He has not caught a stone in his urine strainer since his symptoms began.   He has never had surgical treatment for calculi in the past.     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, Testicular hypofunction, Hypogonadism, 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 BPH w/LUTS, 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 Weak Urinary Stream, Weak urinary stream  - 05/08/2015 Peyronies Disease, Peyronie's disease - 2014    NON-GU PMH: Encounter for general adult medical examination without abnormal findings, Encounter for preventive health examination - 05/08/2015 Herpesviral infection of urogenital system, unspecified, Herpes, genital - 2014 Unspecified osteoarthritis, unspecified site, Arthritis - 2014    FAMILY HISTORY: Brain Cancer - Mother Breast Cancer - Sister Family Health Status Number - Sister Prostate Cancer - Father   SOCIAL HISTORY: Marital Status: Married Current Smoking Status: Patient has never smoked.  Drinks 2 drinks per week. Social Drinker.  Does not drink caffeine.     Notes: Death In The Family Mother   REVIEW OF SYSTEMS:    GU Review Male:   Patient denies frequent urination, hard to postpone urination, burning/ pain with urination, get up at night to urinate, leakage of urine, stream starts and stops, trouble starting your stream, have to strain to urinate , erection problems, and penile pain.  Gastrointestinal (Upper):   Patient reports nausea and vomiting. Patient denies indigestion/ heartburn.  Gastrointestinal (Lower):   Patient reports constipation. Patient denies diarrhea.  Constitutional:   Patient reports night sweats. Patient denies fever, weight loss, and fatigue.  Skin:   Patient denies itching and skin rash/ lesion.  Eyes:   Patient denies blurred vision and double vision.  Ears/ Nose/ Throat:   Patient reports sore throat. Patient denies sinus problems.  Hematologic/Lymphatic:   Patient denies swollen glands and easy bruising.  Cardiovascular:   Patient denies leg swelling and chest pains.  Respiratory:   Patient reports cough. Patient denies shortness  of breath.  Endocrine:   Patient denies excessive thirst.  Musculoskeletal:   Patient reports back pain. Patient denies joint pain.  Neurological:   Patient denies headaches and dizziness.  Psychologic:   Patient denies depression and anxiety.   VITAL  SIGNS:      11/30/2015 08:53 AM  BP 143/75 mmHg  Pulse 73 /min  Temperature 99.2 F / 37 C   MULTI-SYSTEM PHYSICAL EXAMINATION:    Constitutional: Thin. No physical deformities. Normally developed. Good grooming.   Respiratory: No labored breathing, no use of accessory muscles.   Cardiovascular: Normal temperature, normal extremity pulses, no swelling, no varicosities.   Skin: No paleness, no jaundice, no cyanosis. No lesion, no ulcer, no rash.   Neurologic / Psychiatric: Oriented to time, oriented to place, oriented to person. No depression, no anxiety, no agitation.   Gastrointestinal: No mass, no tenderness, no rigidity, non obese abdomen.      PAST DATA REVIEWED:  Source Of History:  Patient  Records Review:   Previous Hospital Records, Previous Patient Records  Urine Test Review:   Urinalysis  X-Ray Review: [facility].T. Stone Protocol: Reviewed Films. Reviewed Report. Right lower pole renal calculus. Right proximal ureteral calculus at L3.     10/13/15 09/13/14 03/09/14 03/02/13 08/25/12 11/28/11 07/26/10 02/19/08  PSA  Total PSA 2.11 ng/dl 2.31  2.35  2.37  2.28  1.70  1.68  1.62     10/13/15 03/21/15 09/13/14 03/08/14 08/30/13 03/02/13 08/25/12 03/20/12  Hormones  Testosterone, Total 1405.4 pg/dL 735  735  360  1000  218  1484.89  269     PROCEDURES:         KUB - 74000  A single view of the abdomen is obtained.  Calculi:  Mid right ureter calculi. L4. Some progression from CT scout film               Urinalysis w/Scope - 81001 Dipstick Dipstick Cont'd Micro  Specimen: Voided Bilirubin: Neg WBC/hpf: 6-10/hpf  Color: Yellow Ketones: Neg RBC/hpf: 3-10/hpf  Appearance: Clear Blood: 3+ Bacteria: Rare  Specific Gravity: 1.025 Protein: 2+ Cystals: Amorph Urates  pH: 5.0 Urobilinogen: 0.2 Casts: NS (Not Seen)  Glucose: Neg Nitrites: Neg Trichomonas: Not Present    Leukocyte Esterase: 1+ Mucous: Not Present      Epithelial Cells: 0-5/hpf      Yeast: NS (Not Seen)      Sperm:  Not Present    ASSESSMENT:      ICD-10 Details  1 GU:   Calculus Ureter - N20.1 Right, Acute - Stat BUN/creatine Slight progression of right proximal ureteral calculus. Now at mid ureter L4. Recommend proceeding with urgent cystourethroscopy, (R) RPG, possible stone extraction, and double J stent placement with worsening creatine now 2.5   PLAN:           Orders Labs BUN, Creatinine and GFR(Stat)  X-Rays: KUB          Schedule         Document Letter(s):  Created for Patient: Clinical Summary         Notes:   Discussed with Dr. Junious Silk. He reviewed both KUB and CT and agrees with worsening creatine urgent cystourethroscopy, (R) RPG, possible stone extraction, and double J stent placement is required today. Risks and benefits discussed with pt and all questions answered. Pt wishes to proceed.     * Signed by Jesus [name] on 11/30/15 at 9:55 AM (EDT)*     The information contained in this medical  record document is considered private and confidential patient information. This information can only be used for the medical diagnosis and/or medical services that are being provided by the patient's selected caregivers. This information can only be distributed outside of the patient's care if the patient agrees and signs waivers of authorization for this information to be sent to an outside source or route.  Addendum: Patient chart and imaging reviewed.  I discussed the patient with nurse practitioner Rosalyn Gess and agree with her assessment and plan.

## 2015-11-30 NOTE — Discharge Instructions (Signed)
Ureteral Stent Implantation, Care After Refer to this sheet in the next few weeks. These instructions provide you with information on caring for yourself after your procedure. Your health care provider may also give you more specific instructions. Your treatment has been planned according to current medical practices, but problems sometimes occur. Call your health care provider if you have any problems or questions after your procedure. WHAT TO EXPECT AFTER THE PROCEDURE You should be back to normal activity within 48 hours after the procedure. Nausea and vomiting may occur and are commonly the result of anesthesia. It is common to experience sharp pain in the back or lower abdomen and penis with voiding. This is caused by movement of the ends of the stent with the act of urinating.It usually goes away within minutes after you have stopped urinating. HOME CARE INSTRUCTIONS Make sure to drink plenty of fluids. You may have small amounts of bleeding, causing your urine to be red. This is normal. Certain movements may trigger pain or a feeling that you need to urinate. You may be given medicines to prevent infection or bladder spasms. Be sure to take all medicines as directed. Only take over-the-counter or prescription medicines for pain, discomfort, or fever as directed by your health care provider. Do not take aspirin, as this can make bleeding worse.  The stent is temporary. The stent must be removed or changed. The stent will be left in until the stones are removed. This may take a few weeks or longer. The office will call for follow-up.  Be sure to keep all follow-up appointments so your health care provider can check that you are healing properly.  SEEK MEDICAL CARE IF:  You experience increasing pain.  Your pain medicine is not working. SEEK IMMEDIATE MEDICAL CARE IF:  Your urine is dark red or has blood clots.  You are leaking urine (incontinent).  You have a fever, chills, feeling sick to  your stomach (nausea), or vomiting.  Your pain is not relieved by pain medicine.  The end of the stent comes out of the urethra.  You are unable to urinate.   This information is not intended to replace advice given to you by your health care provider. Make sure you discuss any questions you have with your health care provider.   Document Released: 12/02/2012 Document Revised: 04/06/2013 Document Reviewed: 10/14/2014 Elsevier Interactive Patient Education 2016 [place] An[facility] Home Care Instructions  Activity: Get plenty of rest for the remainder of the day. A responsible adult should stay with you for 24 hours following the procedure.  For the next 24 hours, DO NOT: -Drive a car -Paediatric nurse -Drink alcoholic beverages -Take any medication unless instructed by your physician -Make any legal decisions or sign important papers.  Meals: Start with liquid foods such as gelatin or soup. Progress to regular foods as tolerated. Avoid greasy, spicy, heavy foods. If nausea and/or vomiting occur, drink only clear liquids until the nausea and/or vomiting subsides. Call your physician if vomiting continues.  Special Instructions/Symptoms: Your throat may feel dry or sore from the anesthesia or the breathing tube placed in your throat during surgery. If this causes discomfort, gargle with warm salt water. The discomfort should disappear within 24 hours.  If you had a scopolamine patch placed behind your ear for the management of post- operative nausea and/or vomiting:  1. The medication in the patch is effective for 72 hours, after which it should be removed.  Wrap patch in a  tissue and discard in the trash. Wash hands thoroughly with soap and water. 2. You may remove the patch earlier than 72 hours if you experience unpleasant side effects which may include dry mouth, dizziness or visual disturbances. 3. Avoid touching the patch. Wash your hands with soap and water after  contact with the patch.

## 2015-11-30 NOTE — Anesthesia Postprocedure Evaluation (Signed)
Anesthesia Post Note  Patient: Jesus [name]  Procedure(s) Performed: Procedure(s) (LRB): CYSTOSCOPY/RETROGRADE/URETEROSCOPY/ (Right)  Patient location during evaluation: PACU Anesthesia Type: General Level of consciousness: awake and alert Pain management: pain level controlled Vital Signs Assessment: post-procedure vital signs reviewed and stable Respiratory status: spontaneous breathing, nonlabored ventilation, respiratory function stable and patient connected to nasal cannula oxygen Cardiovascular status: blood pressure returned to baseline and stable Postop Assessment: no signs of nausea or vomiting Anesthetic complications: no    Last Vitals:  Vitals:   11/30/15 1429 11/30/15 1445  BP: (!) 144/66 132/68  Pulse: 70 69  Resp: 10 11  Temp: 37.1 C     Last Pain:  Vitals:   11/30/15 1429  TempSrc:   PainSc: Asleep                 Reginal Lutes

## 2015-11-30 NOTE — Transfer of Care (Signed)
Immediate Anesthesia Transfer of Care Note  Patient: Jesus [name]  Procedure(s) Performed: Procedure(s): CYSTOSCOPY/RETROGRADE/URETEROSCOPY/ (Right)  Patient Location: PACU  Anesthesia Type:General  Level of Consciousness: awake, alert , oriented and patient cooperative  Airway & Oxygen Therapy: Patient Spontanous Breathing and Patient connected to nasal cannula oxygen  Post-op Assessment: Report given to RN and Post -op Vital signs reviewed and stable  Post vital signs: Reviewed and stable  Last Vitals:  Vitals:   11/30/15 1030  BP: (!) 147/71  Pulse: 65  Resp: 16  Temp: 37.2 C    Last Pain:  Vitals:   11/30/15 1030  TempSrc: Oral      Patients Stated Pain Goal: 7 ([id number])  Complications: No apparent anesthesia complications

## 2015-11-30 NOTE — Op Note (Signed)
Preoperative diagnosis: Right proximal ureteral stone, right renal stone, hydronephrosis, acute renal failure Postoperative diagnosis: Same  Procedure: Cystoscopy with right retrograde pyelogram, right ureteroscopy, right ureteral stent placement  Surgeon: Junious Silk  Anesthesia: Gen.  Indication for procedure: 70 year old with symptomatic right proximal ureteral stone and rising creatinine. He was brought for urgent stent and we hope to also remove the stone if possible.   findings: Cystourethroscopy was unremarkable. There were no stones or foreign bodies in the bladder.  Right retrograde pyelogram-this outlined a single ureter single collecting system unit. The distal and mid ureter were quite delicate and narrow.  Proximally there was a filling defect and initially no retrograde progression of contrast but contrast began to go around the stone and filled a dilated proximal ureter and collecting system.  After the wire was passed and after the stent was placed there was copious amount of debris-filled dark urine emanating from the stent.  I did send an i-STAT which confirmed sodium and potassium were normal.  Description of procedure: After consent was obtained patient brought to the operating room. After adequate anesthesia he is placed in lithotomy position and prepped and draped in the usual sterile fashion. A timeout was performed to confirm the patient and procedure. The cystoscope was passed per urethra and the bladder inspected. I attempted to cannulate the right ureteral orifice with a 6 [place] open-ended catheter for retrograde but was too narrow. This was exchanged with a cone-tipped catheter which was placed over the right ureteral orifice and retrograde injection of contrast was performed. A sensor wire was then advanced and initially had trouble getting past the stone. The scope was broken to drain the bladder and then removed. A 6 French open-ended catheter was loaded over the wire  which a guidewire up into the collecting system. The 6 which open-ended catheter was advanced into the collecting system and the wire removed. There was good hydronephrotic drip. A second retrograde confirmed collecting system placement of the catheter. The wire was advanced back into the upper pole collecting system and a 6 [place] open-ended catheter removed. I tried to pass the inner cannula of an access sheath but it would not progress through the distal ureter. I then took the single-channel 4.6 French semirigid ureteroscope and also this would not pass. I thought it was best to leave a stent and the wire was backloaded on the cystoscope and a 6 x 26 and ureter stent was advanced. The wire was removed with a good coil seen in the collecting system and a good coil in the bladder. There was good drainage of urine through the stent coil. The bladder was drained and the scope removed. Lidocaine jelly was instilled per urethra. He was awakened and taken to recovery room in stable condition.  Complications: None Blood loss: 0 Specimens: None  Drains: 6 x 26 cm right ureteral stent  Disposition: Patient stable to PACU

## 2015-11-30 NOTE — Anesthesia Preprocedure Evaluation (Signed)
Anesthesia Evaluation  Patient identified by MRN, date of birth, ID band Patient awake    Reviewed: Allergy & Precautions, NPO status , Patient's Chart, lab work & pertinent test results  Airway Mallampati: I  TM Distance: >3 FB Neck ROM: Full    Dental  (+) Teeth Intact, Dental Advisory Given   Pulmonary    breath sounds clear to auscultation       Cardiovascular  Rhythm:Regular Rate:Normal     Neuro/Psych    GI/Hepatic   Endo/Other    Renal/GU      Musculoskeletal   Abdominal   Peds  Hematology   Anesthesia Other Findings Renal stone on Right  Pt had cereal with milk at 0700 today.    Reproductive/Obstetrics                             Anesthesia Physical Anesthesia Plan  ASA: II  Anesthesia Plan: General   Post-op Pain Management:    Induction: Intravenous  Airway Management Planned: LMA  Additional Equipment:   Intra-op Plan:   Post-operative Plan: Extubation in OR  Informed Consent: I have reviewed the patients History and Physical, chart, labs and discussed the procedure including the risks, benefits and alternatives for the proposed anesthesia with the patient or authorized representative who has indicated his/her understanding and acceptance.   Dental advisory given  Plan Discussed with: CRNA, Anesthesiologist and Surgeon  Anesthesia Plan Comments:         Anesthesia Quick Evaluation

## 2015-11-30 NOTE — Anesthesia Procedure Notes (Signed)
Procedure Name: Intubation Date/Time: 11/30/2015 1:36 PM Performed by: Wanita Chamberlain Pre-anesthesia Checklist: Patient identified, Timeout performed, Emergency Drugs available, Suction available and Patient being monitored Patient Re-evaluated:Patient Re-evaluated prior to inductionOxygen Delivery Method: Circle system utilized Preoxygenation: Pre-oxygenation with 100% oxygen Intubation Type: IV induction Ventilation: Mask ventilation without difficulty LMA: LMA inserted LMA Size: 4.0 Number of attempts: 1 Placement Confirmation: positive ETCO2 and breath sounds checked- equal and bilateral Tube secured with: Tape Dental Injury: Teeth and Oropharynx as per pre-operative assessment

## 2015-12-01 ENCOUNTER — Encounter (HOSPITAL_BASED_OUTPATIENT_CLINIC_OR_DEPARTMENT_OTHER): Payer: Self-pay | Admitting: Urology

## 2015-12-05 ENCOUNTER — Other Ambulatory Visit: Payer: Self-pay | Admitting: Urology

## 2015-12-06 ENCOUNTER — Other Ambulatory Visit: Payer: Self-pay | Admitting: Urology

## 2015-12-14 ENCOUNTER — Encounter (HOSPITAL_BASED_OUTPATIENT_CLINIC_OR_DEPARTMENT_OTHER): Payer: Self-pay | Admitting: *Deleted

## 2015-12-14 NOTE — Progress Notes (Signed)
NPO AFTER MN.  ARRIVE AT 0900.  CURRENT ISTAT 11-30-2015.  WILL TAKE FLOMAX AM DOS W/ SIPS OF WATER .

## 2015-12-20 ENCOUNTER — Encounter (HOSPITAL_BASED_OUTPATIENT_CLINIC_OR_DEPARTMENT_OTHER)
Admission: RE | Disposition: A | Discharge status: Home / Self Care | Payer: Self-pay | Source: Ambulatory Visit | Attending: Urology

## 2015-12-20 ENCOUNTER — Ambulatory Visit (HOSPITAL_BASED_OUTPATIENT_CLINIC_OR_DEPARTMENT_OTHER): Payer: Federal, State, Local not specified - PPO | Admitting: Anesthesiology

## 2015-12-20 ENCOUNTER — Ambulatory Visit (HOSPITAL_BASED_OUTPATIENT_CLINIC_OR_DEPARTMENT_OTHER)
Admission: RE | Admit: 2015-12-20 | Discharge: 2015-12-20 | Disposition: A | Discharge status: Home / Self Care | Payer: Federal, State, Local not specified - PPO | Source: Ambulatory Visit | Attending: Urology | Admitting: Urology

## 2015-12-20 ENCOUNTER — Encounter (HOSPITAL_BASED_OUTPATIENT_CLINIC_OR_DEPARTMENT_OTHER): Payer: Self-pay | Admitting: *Deleted

## 2015-12-20 DIAGNOSIS — N401 Enlarged prostate with lower urinary tract symptoms: Secondary | ICD-10-CM | POA: Diagnosis not present

## 2015-12-20 DIAGNOSIS — Z79899 Other long term (current) drug therapy: Secondary | ICD-10-CM | POA: Diagnosis not present

## 2015-12-20 DIAGNOSIS — N202 Calculus of kidney with calculus of ureter: Secondary | ICD-10-CM | POA: Insufficient documentation

## 2015-12-20 DIAGNOSIS — T402X5A Adverse effect of other opioids, initial encounter: Secondary | ICD-10-CM | POA: Diagnosis not present

## 2015-12-20 DIAGNOSIS — N138 Other obstructive and reflux uropathy: Secondary | ICD-10-CM | POA: Diagnosis not present

## 2015-12-20 DIAGNOSIS — E291 Testicular hypofunction: Secondary | ICD-10-CM | POA: Diagnosis not present

## 2015-12-20 DIAGNOSIS — K5900 Constipation, unspecified: Secondary | ICD-10-CM | POA: Diagnosis not present

## 2015-12-20 HISTORY — DX: Personal history of other infectious and parasitic diseases: Z86.19

## 2015-12-20 HISTORY — DX: Personal history of adenomatous and serrated colon polyps: Z86.0101

## 2015-12-20 HISTORY — DX: Testicular hypofunction: E29.1

## 2015-12-20 HISTORY — DX: Personal history of other diseases of urinary system: Z87.448

## 2015-12-20 HISTORY — DX: Induration penis plastica: N48.6

## 2015-12-20 HISTORY — DX: Personal history of colonic polyps: Z86.010

## 2015-12-20 HISTORY — DX: Calculus of ureter: N20.1

## 2015-12-20 HISTORY — DX: Unspecified symptoms and signs involving the genitourinary system: R39.9

## 2015-12-20 HISTORY — DX: Elevated prostate specific antigen (PSA): R97.20

## 2015-12-20 HISTORY — DX: Benign prostatic hyperplasia without lower urinary tract symptoms: N40.0

## 2015-12-20 HISTORY — DX: Male erectile dysfunction, unspecified: N52.9

## 2015-12-20 HISTORY — PX: CYSTOSCOPY/URETEROSCOPY/HOLMIUM LASER/STENT PLACEMENT: SHX6546

## 2015-12-20 HISTORY — DX: Calculus of kidney: N20.0

## 2015-12-20 SURGERY — CYSTOSCOPY/URETEROSCOPY/HOLMIUM LASER/STENT PLACEMENT
Anesthesia: General | Site: Ureter | Laterality: Right

## 2015-12-20 MED ORDER — ONDANSETRON HCL 4 MG/2ML IJ SOLN
4.0000 mg | Freq: Four times a day (QID) | INTRAMUSCULAR | Status: DC | PRN
Start: 2015-12-20 — End: 2015-12-20
  Filled 2015-12-20: qty 2

## 2015-12-20 MED ORDER — KETOROLAC TROMETHAMINE 30 MG/ML IJ SOLN
INTRAMUSCULAR | Status: AC
  Filled 2015-12-20: qty 1

## 2015-12-20 MED ORDER — ONDANSETRON HCL 4 MG/2ML IJ SOLN
INTRAMUSCULAR | Status: AC
Start: 2015-12-20 — End: 2015-12-20
  Filled 2015-12-20: qty 2

## 2015-12-20 MED ORDER — ONDANSETRON HCL 4 MG/2ML IJ SOLN
INTRAMUSCULAR | Status: DC | PRN
  Administered 2015-12-20: 4 mg via INTRAVENOUS

## 2015-12-20 MED ORDER — DEXAMETHASONE SODIUM PHOSPHATE 10 MG/ML IJ SOLN
INTRAMUSCULAR | Status: AC
  Filled 2015-12-20: qty 1

## 2015-12-20 MED ORDER — LIDOCAINE 2% (20 MG/ML) 5 ML SYRINGE
INTRAMUSCULAR | Status: AC
  Filled 2015-12-20: qty 10

## 2015-12-20 MED ORDER — LIDOCAINE 2% (20 MG/ML) 5 ML SYRINGE
INTRAMUSCULAR | Status: DC | PRN
Start: 2015-12-20 — End: 2015-12-20
  Administered 2015-12-20: 80 mg via INTRAVENOUS

## 2015-12-20 MED ORDER — FENTANYL CITRATE (PF) 100 MCG/2ML IJ SOLN
INTRAMUSCULAR | Status: DC | PRN
  Administered 2015-12-20: 50 ug via INTRAVENOUS
  Administered 2015-12-20: 25 ug via INTRAVENOUS

## 2015-12-20 MED ORDER — FENTANYL CITRATE (PF) 100 MCG/2ML IJ SOLN
INTRAMUSCULAR | Status: AC
Start: 2015-12-20 — End: 2015-12-20
  Filled 2015-12-20: qty 2

## 2015-12-20 MED ORDER — MIDAZOLAM HCL 2 MG/2ML IJ SOLN
INTRAMUSCULAR | Status: AC
  Filled 2015-12-20: qty 2

## 2015-12-20 MED ORDER — OXYCODONE HCL 5 MG PO TABS
5.0000 mg | ORAL_TABLET | Freq: Once | ORAL | Status: DC | PRN
  Filled 2015-12-20: qty 1

## 2015-12-20 MED ORDER — PROPOFOL 10 MG/ML IV BOLUS
INTRAVENOUS | Status: AC
  Filled 2015-12-20: qty 20

## 2015-12-20 MED ORDER — LIDOCAINE HCL (CARDIAC) 20 MG/ML IV SOLN: INTRAVENOUS | Status: DC | PRN

## 2015-12-20 MED ORDER — MIDAZOLAM HCL 5 MG/5ML IJ SOLN
INTRAMUSCULAR | Status: DC | PRN
  Administered 2015-12-20 (×2): 1 mg via INTRAVENOUS

## 2015-12-20 MED ORDER — SODIUM CHLORIDE 0.9 % IR SOLN
Status: DC | PRN
  Administered 2015-12-20: 4000 mL
  Administered 2015-12-20: 500 mL

## 2015-12-20 MED ORDER — FENTANYL CITRATE (PF) 100 MCG/2ML IJ SOLN
25.0000 ug | INTRAMUSCULAR | Status: DC | PRN
  Filled 2015-12-20: qty 1

## 2015-12-20 MED ORDER — ONDANSETRON HCL 4 MG/2ML IJ SOLN
INTRAMUSCULAR | Status: AC
  Filled 2015-12-20: qty 2

## 2015-12-20 MED ORDER — CEFAZOLIN SODIUM-DEXTROSE 2-4 GM/100ML-% IV SOLN
INTRAVENOUS | Status: AC
  Filled 2015-12-20: qty 100

## 2015-12-20 MED ORDER — CEFAZOLIN SODIUM-DEXTROSE 2-4 GM/100ML-% IV SOLN
2.0000 g | Freq: Once | INTRAVENOUS | Status: AC
  Administered 2015-12-20: 2 g via INTRAVENOUS
  Filled 2015-12-20: qty 100

## 2015-12-20 MED ORDER — LACTATED RINGERS IV SOLN
INTRAVENOUS | Status: DC
  Administered 2015-12-20: 09:00:00 via INTRAVENOUS
  Filled 2015-12-20: qty 1000

## 2015-12-20 MED ORDER — OXYCODONE HCL 5 MG/5ML PO SOLN
5.0000 mg | Freq: Once | ORAL | Status: DC | PRN
  Filled 2015-12-20: qty 5

## 2015-12-20 MED ORDER — PROPOFOL 10 MG/ML IV BOLUS
INTRAVENOUS | Status: DC | PRN
  Administered 2015-12-20: 180 mg via INTRAVENOUS

## 2015-12-20 MED ORDER — LIDOCAINE HCL 2 % EX GEL
CUTANEOUS | Status: DC | PRN
  Administered 2015-12-20: 1 via URETHRAL

## 2015-12-20 SURGICAL SUPPLY — 31 items
BAG DRAIN URO-CYSTO SKYTR STRL (DRAIN) ×2 IMPLANT
BASKET LASER NITINOL 1.9FR (BASKET) IMPLANT
BASKET STNLS GEMINI 4WIRE 3FR (BASKET) IMPLANT
BASKET ZERO TIP NITINOL 2.4FR (BASKET) ×2 IMPLANT
BENZOIN TINCTURE PRP APPL 2/3 (GAUZE/BANDAGES/DRESSINGS) IMPLANT
CATH INTERMIT  6FR 70CM (CATHETERS) IMPLANT
CATH URET 5FR 28IN CONE TIP (BALLOONS)
CATH URET 5FR 28IN OPEN ENDED (CATHETERS) IMPLANT
CATH URET 5FR 70CM CONE TIP (BALLOONS) IMPLANT
CLOTH BEACON ORANGE TIMEOUT ST (SAFETY) ×2 IMPLANT
DRSG TEGADERM 2-3/8X2-3/4 SM (GAUZE/BANDAGES/DRESSINGS) IMPLANT
FIBER LASER FLEXIVA 365 (UROLOGICAL SUPPLIES) IMPLANT
FIBER LASER LITHO 273 (Laser) ×2 IMPLANT
FIBER LASER TRAC TIP (UROLOGICAL SUPPLIES) ×2 IMPLANT
GLOVE BIO SURGEON STRL SZ7.5 (GLOVE) ×2 IMPLANT
GOWN STRL REUS W/ TWL XL LVL3 (GOWN DISPOSABLE) ×1 IMPLANT
GOWN STRL REUS W/TWL XL LVL3 (GOWN DISPOSABLE) ×1
GUIDEWIRE 0.038 PTFE COATED (WIRE) IMPLANT
GUIDEWIRE ANG ZIPWIRE 038X150 (WIRE) IMPLANT
GUIDEWIRE STR DUAL SENSOR (WIRE) ×4 IMPLANT
IV NS IRRIG 3000ML ARTHROMATIC (IV SOLUTION) ×4 IMPLANT
KIT BALLIN UROMAX 15FX10 (LABEL) IMPLANT
KIT BALLN UROMAX 15FX4 (MISCELLANEOUS) IMPLANT
KIT BALLN UROMAX 26 75X4 (MISCELLANEOUS)
KIT ROOM TURNOVER WOR (KITS) ×2 IMPLANT
MANIFOLD NEPTUNE II (INSTRUMENTS) ×2 IMPLANT
NS IRRIG 500ML POUR BTL (IV SOLUTION) ×2 IMPLANT
PACK CYSTO (CUSTOM PROCEDURE TRAY) ×2 IMPLANT
SET HIGH PRES BAL DIL (LABEL)
SHEATH ACCESS URETERAL 38CM (SHEATH) ×2 IMPLANT
TUBE CONNECTING 12X1/4 (SUCTIONS) IMPLANT

## 2015-12-20 NOTE — Discharge Instructions (Addendum)

## 2015-12-20 NOTE — Transfer of Care (Signed)
Immediate Anesthesia Transfer of Care Note  Patient: Jesus [name]  Procedure(s) Performed: Procedure(s) with comments: CYSTOSCOPY/URETEROSCOPY/HOLMIUM LASER/ REMOVE RIGHT JJ STENT (Right) - 1 HOUR  Patient Location: PACU  Anesthesia Type:General  Level of Consciousness: sedated and responds to stimulation  Airway & Oxygen Therapy: Patient Spontanous Breathing and Patient connected to nasal cannula oxygen  Post-op Assessment: Report given to RN  Post vital signs: Reviewed and unstable  Last Vitals:131/79, 73, 10, 99%, 97.1  Vitals:   12/20/15 0906  BP: 114/68  Pulse: 65  Resp: 16  Temp: 36.9 C    Last Pain:  Vitals:   12/20/15 0906  TempSrc: Oral      Patients Stated Pain Goal: 7 ([id number])  Complications: No apparent anesthesia complications

## 2015-12-20 NOTE — Anesthesia Procedure Notes (Signed)
Procedure Name: LMA Insertion Date/Time: 12/20/2015 11:06 AM Performed by: Marcie Bal, ADAM Pre-anesthesia Checklist: Patient identified, Emergency Drugs available, Suction available and Patient being monitored Patient Re-evaluated:Patient Re-evaluated prior to inductionOxygen Delivery Method: Circle system utilized Preoxygenation: Pre-oxygenation with 100% oxygen Intubation Type: IV induction Ventilation: Mask ventilation without difficulty LMA: LMA inserted LMA Size: 4.0 Number of attempts: 1 Airway Equipment and Method: Bite block Placement Confirmation: positive ETCO2 Tube secured with: Tape Dental Injury: Teeth and Oropharynx as per pre-operative assessment

## 2015-12-20 NOTE — Interval H&P Note (Signed)
History and Physical Interval Note:  12/20/2015 10:53 AM  [name]  has presented today for surgery, with the diagnosis of RIGHT PROXIMAL URETERAL STONE  The various methods of treatment have been discussed with the patient and family. After consideration of risks, benefits and other options for treatment, the patient has consented to  Procedure([name]) with comments: CYSTOSCOPY/URETEROSCOPY/HOLMIUM LASER/STENT PLACEMENT REMOVE RIGHT JJ STENT (Right) - 1 HOUR as a surgical intervention .  The patient'[name] history has been reviewed, patient examined, no change in status, stable for surgery.  I have reviewed the patient'[name] chart and labs.  Questions were answered to the patient'[name] satisfaction.     Jesus [name]

## 2015-12-20 NOTE — Anesthesia Preprocedure Evaluation (Signed)
Anesthesia Evaluation  Patient identified by MRN, date of birth, ID band Patient awake    Reviewed: Allergy & Precautions, H&P , NPO status , Patient's Chart, lab work & pertinent test results  Airway Mallampati: II   Neck ROM: full    Dental   Pulmonary neg pulmonary ROS,    breath sounds clear to auscultation       Cardiovascular negative cardio ROS   Rhythm:regular Rate:Normal     Neuro/Psych    GI/Hepatic   Endo/Other    Renal/GU Renal diseasestones     Musculoskeletal  (+) Arthritis ,   Abdominal   Peds  Hematology   Anesthesia Other Findings   Reproductive/Obstetrics                             Anesthesia Physical Anesthesia Plan  ASA: II  Anesthesia Plan: General   Post-op Pain Management:    Induction: Intravenous  Airway Management Planned: LMA  Additional Equipment:   Intra-op Plan:   Post-operative Plan:   Informed Consent: I have reviewed the patients History and Physical, chart, labs and discussed the procedure including the risks, benefits and alternatives for the proposed anesthesia with the patient or authorized representative who has indicated his/her understanding and acceptance.     Plan Discussed with: CRNA, Anesthesiologist and Surgeon  Anesthesia Plan Comments:         Anesthesia Quick Evaluation

## 2015-12-20 NOTE — Op Note (Signed)
Preoperative diagnosis right retained JJ stent, ureteral calculus, renal calculus Postoperative diagnosis: Same  Procedure: Cystoscopy, removal of right double-J stent, rigid ureteroscopy, flexible ureteroscopy, holmium laser lithotripsy with basketing of stone fragments   Surgeon: Bernestine Amass M.D.  Anesthesia: Gen.  Indications: Jesus [name] presented recently with right flank pain was diagnosed with a right proximal ureteral stone. He was taken to surgery urgently by Dr. Junious Silk secondary to ongoing severe discomfort. Attempt at definitive stone management was unsuccessful because of narrowing of the proximal ureter and for that reason a double-J stent was placed. He has had a double-J stent in place for 3 weeks. He presents now for definitive management of his proximal ureteral stone. That stone was a proximally 5 mm. There was also an additional 5 mm right renal stone which we will attempt to remove as well.     Technique and findings: Patient was brought the operating room where he had successful induction of general anesthesia. Cystoscopy revealed unremarkable anterior urethra and prostatic urethra. Bladder showed some mild trigonal edema and erythema and a right indwelling JJ stent.   The stent was removed partially and a guidewire placed through the stent to the renal pelvis with fluoroscopic guidance. A 6 French rigid long ureteroscope was then engaged. One could see a 5 mm stone right at the ureteral pelvic junction. We removed the rigid ureteroscope and placed an access sheath. A digital flexible ureteroscope was then used. 5 mm stone was encountered and fracture with a holmium laser at 0.8 J 8 Hz. 3-4 larger pieces were basket extracted. A second 5 mm stone was noted in a midpole calyx which is also fragmented and then the pieces removed. We did not feel replacement of a double-J stent was necessary. The patient was brought to recovery room in stable condition. No obvious complications  occurred.

## 2015-12-20 NOTE — H&P (View-Only) (Signed)
Office Visit Report     11/30/2015   --------------------------------------------------------------------------------   Jesus [name]. [name]  MRN: [id number]  PRIMARY CARE:  Jesus Gros L. Jesus Sanger, MD  DOB: Aug 19, 1945, 70 year old Male  REFERRING:    SSN: [id number]  PROVIDER:  [name], M.D.    TREATING:  Jesus [name]    LOCATION:  [facility] [phone]   --------------------------------------------------------------------------------   CC: I have ureteral stone.  HPI: Jesus [name] is a 70 year-old male established patient who is here for ureteral stone.  Pain yesterday was nonexistance but today has again flared back up. Is having severe constipation with PRN pain med.   Previous SCr 1.48   The problem is on the right side. He first stated noticing pain on 11/27/2015. This is his first kidney stone. He is currently having flank pain and back pain. He denies having groin pain, nausea, vomiting, fever, and chills. Pain is occuring on the right side. He has not caught a stone in his urine strainer since his symptoms began.   He has never had surgical treatment for calculi in the past.     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, Testicular hypofunction, Hypogonadism, 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 BPH w/LUTS, 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 Weak Urinary Stream, Weak urinary stream  - 05/08/2015 Peyronies Disease, Peyronie's disease - 2014    NON-GU PMH: Encounter for general adult medical examination without abnormal findings, Encounter for preventive health examination - 05/08/2015 Herpesviral infection of urogenital system, unspecified, Herpes, genital - 2014 Unspecified osteoarthritis, unspecified site, Arthritis - 2014    FAMILY HISTORY: Brain Cancer - Mother Breast Cancer - Sister Family Health Status Number - Sister Prostate Cancer - Father   SOCIAL HISTORY: Marital Status: Married Current Smoking Status: Patient has never smoked.  Drinks 2 drinks per week. Social Drinker.  Does not drink caffeine.     Notes: Death In The Family Mother   REVIEW OF SYSTEMS:    GU Review Male:   Patient denies frequent urination, hard to postpone urination, burning/ pain with urination, get up at night to urinate, leakage of urine, stream starts and stops, trouble starting your stream, have to strain to urinate , erection problems, and penile pain.  Gastrointestinal (Upper):   Patient reports nausea and vomiting. Patient denies indigestion/ heartburn.  Gastrointestinal (Lower):   Patient reports constipation. Patient denies diarrhea.  Constitutional:   Patient reports night sweats. Patient denies fever, weight loss, and fatigue.  Skin:   Patient denies itching and skin rash/ lesion.  Eyes:   Patient denies blurred vision and double vision.  Ears/ Nose/ Throat:   Patient reports sore throat. Patient denies sinus problems.  Hematologic/Lymphatic:   Patient denies swollen glands and easy bruising.  Cardiovascular:   Patient denies leg swelling and chest pains.  Respiratory:   Patient reports cough. Patient denies shortness  of breath.  Endocrine:   Patient denies excessive thirst.  Musculoskeletal:   Patient reports back pain. Patient denies joint pain.  Neurological:   Patient denies headaches and dizziness.  Psychologic:   Patient denies depression and anxiety.   VITAL  SIGNS:      11/30/2015 08:53 AM  BP 143/75 mmHg  Pulse 73 /min  Temperature 99.2 F / 37 C   MULTI-SYSTEM PHYSICAL EXAMINATION:    Constitutional: Thin. No physical deformities. Normally developed. Good grooming.   Respiratory: No labored breathing, no use of accessory muscles.   Cardiovascular: Normal temperature, normal extremity pulses, no swelling, no varicosities.   Skin: No paleness, no jaundice, no cyanosis. No lesion, no ulcer, no rash.   Neurologic / Psychiatric: Oriented to time, oriented to place, oriented to person. No depression, no anxiety, no agitation.   Gastrointestinal: No mass, no tenderness, no rigidity, non obese abdomen.      PAST DATA REVIEWED:  Source Of History:  Patient  Records Review:   Previous Hospital Records, Previous Patient Records  Urine Test Review:   Urinalysis  X-Ray Review: [facility] Protocol: Reviewed Films. Reviewed Report. Right lower pole renal calculus. Right proximal ureteral calculus at L3.     10/13/15 09/13/14 03/09/14 03/02/13 08/25/12 11/28/11 07/26/10 02/19/08  PSA  Total PSA 2.11 ng/dl 2.31  2.35  2.37  2.28  1.70  1.68  1.62     10/13/15 03/21/15 09/13/14 03/08/14 08/30/13 03/02/13 08/25/12 03/20/12  Hormones  Testosterone, Total 1405.4 pg/dL 735  735  360  1000  218  1484.89  269     PROCEDURES:         KUB - 74000  A single view of the abdomen is obtained.  Calculi:  Mid right ureter calculi. L4. Some progression from CT scout film               Urinalysis w/Scope - 81001 Dipstick Dipstick Cont'd Micro  Specimen: Voided Bilirubin: Neg WBC/hpf: 6-10/hpf  Color: Yellow Ketones: Neg RBC/hpf: 3-10/hpf  Appearance: Clear Blood: 3+ Bacteria: Rare  Specific Gravity: 1.025 Protein: 2+ Cystals: Amorph Urates  pH: 5.0 Urobilinogen: 0.2 Casts: NS (Not Seen)  Glucose: Neg Nitrites: Neg Trichomonas: Not Present    Leukocyte Esterase: 1+ Mucous: Not Present      Epithelial Cells: 0-5/hpf      Yeast: NS (Not Seen)      Sperm:  Not Present    ASSESSMENT:      ICD-10 Details  1 GU:   Calculus Ureter - N20.1 Right, Acute - Stat BUN/creatine Slight progression of right proximal ureteral calculus. Now at mid ureter L4. Recommend proceeding with urgent cystourethroscopy, (R) RPG, possible stone extraction, and double J stent placement with worsening creatine now 2.5   PLAN:           Orders Labs BUN, Creatinine and GFR(Stat)  X-Rays: KUB          Schedule         Document Letter(s):  Created for Patient: Clinical Summary         Notes:   Discussed with Dr. Junious Silk. He reviewed both KUB and CT and agrees with worsening creatine urgent cystourethroscopy, (R) RPG, possible stone extraction, and double J stent placement is required today. Risks and benefits discussed with pt and all questions answered. Pt wishes to proceed.     * Signed by Jesus [name] on 11/30/15 at 9:55 AM (EDT)*     The information contained in this medical  record document is considered private and confidential patient information. This information can only be used for the medical diagnosis and/or medical services that are being provided by the patient's selected caregivers. This information can only be distributed outside of the patient's care if the patient agrees and signs waivers of authorization for this information to be sent to an outside source or route.  Addendum: Patient chart and imaging reviewed.  I discussed the patient with nurse practitioner Rosalyn Gess and agree with her assessment and plan.

## 2015-12-21 ENCOUNTER — Encounter (HOSPITAL_BASED_OUTPATIENT_CLINIC_OR_DEPARTMENT_OTHER): Payer: Self-pay | Admitting: Urology

## 2015-12-25 NOTE — Anesthesia Postprocedure Evaluation (Signed)
Anesthesia Post Note  Patient: [name]  Procedure(s) Performed: Procedure(s) (LRB): CYSTOSCOPY/URETEROSCOPY/HOLMIUM LASER/ REMOVE RIGHT JJ STENT (Right)  Patient location during evaluation: PACU Anesthesia Type: General Level of consciousness: awake and alert and patient cooperative Pain management: pain level controlled Vital Signs Assessment: post-procedure vital signs reviewed and stable Respiratory status: spontaneous breathing and respiratory function stable Cardiovascular status: stable Anesthetic complications: no    Last Vitals:  Vitals:   12/20/15 1245 12/20/15 1330  BP: 126/79 136/69  Pulse: 65 61  Resp: (!) 21 18  Temp:  36.7 C    Last Pain:  Vitals:   12/20/15 1330  TempSrc:   PainSc: 0-No pain                 Izeah Vossler S

## 2016-04-16 DIAGNOSIS — E611 Iron deficiency: Secondary | ICD-10-CM | POA: Diagnosis not present

## 2016-04-16 DIAGNOSIS — D8989 Other specified disorders involving the immune mechanism, not elsewhere classified: Secondary | ICD-10-CM | POA: Diagnosis not present

## 2016-04-16 DIAGNOSIS — E559 Vitamin D deficiency, unspecified: Secondary | ICD-10-CM | POA: Diagnosis not present

## 2016-04-16 DIAGNOSIS — E7210 Disorders of sulfur-bearing amino-acid metabolism, unspecified: Secondary | ICD-10-CM | POA: Diagnosis not present

## 2016-05-20 DIAGNOSIS — R509 Fever, unspecified: Secondary | ICD-10-CM | POA: Diagnosis not present

## 2016-05-20 DIAGNOSIS — J101 Influenza due to other identified influenza virus with other respiratory manifestations: Secondary | ICD-10-CM | POA: Diagnosis not present

## 2016-06-11 DIAGNOSIS — E291 Testicular hypofunction: Secondary | ICD-10-CM | POA: Diagnosis not present

## 2016-06-17 DIAGNOSIS — R351 Nocturia: Secondary | ICD-10-CM | POA: Diagnosis not present

## 2016-06-17 DIAGNOSIS — N401 Enlarged prostate with lower urinary tract symptoms: Secondary | ICD-10-CM | POA: Diagnosis not present

## 2016-06-17 DIAGNOSIS — E291 Testicular hypofunction: Secondary | ICD-10-CM | POA: Diagnosis not present

## 2016-06-17 DIAGNOSIS — N5201 Erectile dysfunction due to arterial insufficiency: Secondary | ICD-10-CM | POA: Diagnosis not present

## 2016-07-22 DIAGNOSIS — D235 Other benign neoplasm of skin of trunk: Secondary | ICD-10-CM | POA: Diagnosis not present

## 2016-07-22 DIAGNOSIS — L814 Other melanin hyperpigmentation: Secondary | ICD-10-CM | POA: Diagnosis not present

## 2016-07-22 DIAGNOSIS — L820 Inflamed seborrheic keratosis: Secondary | ICD-10-CM | POA: Diagnosis not present

## 2016-07-22 DIAGNOSIS — L570 Actinic keratosis: Secondary | ICD-10-CM | POA: Diagnosis not present

## 2016-07-22 DIAGNOSIS — D1801 Hemangioma of skin and subcutaneous tissue: Secondary | ICD-10-CM | POA: Diagnosis not present

## 2016-07-22 DIAGNOSIS — L219 Seborrheic dermatitis, unspecified: Secondary | ICD-10-CM | POA: Diagnosis not present

## 2016-07-25 DIAGNOSIS — K080 Exfoliation of teeth due to systemic causes: Secondary | ICD-10-CM | POA: Diagnosis not present

## 2016-11-26 ENCOUNTER — Encounter: Payer: Self-pay | Admitting: Family Medicine

## 2016-11-26 ENCOUNTER — Ambulatory Visit (INDEPENDENT_AMBULATORY_CARE_PROVIDER_SITE_OTHER): Payer: Federal, State, Local not specified - PPO | Admitting: Family Medicine

## 2016-11-26 VITALS — BP 100/63 | HR 71 | Ht 69.25 in | Wt 164.0 lb

## 2016-11-26 DIAGNOSIS — G629 Polyneuropathy, unspecified: Secondary | ICD-10-CM | POA: Insufficient documentation

## 2016-11-26 DIAGNOSIS — N200 Calculus of kidney: Secondary | ICD-10-CM

## 2016-11-26 DIAGNOSIS — M159 Polyosteoarthritis, unspecified: Secondary | ICD-10-CM | POA: Diagnosis not present

## 2016-11-26 DIAGNOSIS — K4090 Unilateral inguinal hernia, without obstruction or gangrene, not specified as recurrent: Secondary | ICD-10-CM

## 2016-11-26 DIAGNOSIS — G6289 Other specified polyneuropathies: Secondary | ICD-10-CM

## 2016-11-26 DIAGNOSIS — M542 Cervicalgia: Secondary | ICD-10-CM

## 2016-11-26 DIAGNOSIS — N400 Enlarged prostate without lower urinary tract symptoms: Secondary | ICD-10-CM

## 2016-11-26 DIAGNOSIS — J3089 Other allergic rhinitis: Secondary | ICD-10-CM | POA: Diagnosis not present

## 2016-11-26 DIAGNOSIS — Z9889 Other specified postprocedural states: Secondary | ICD-10-CM

## 2016-11-26 NOTE — Patient Instructions (Signed)

## 2016-11-26 NOTE — Progress Notes (Signed)
New patient office visit note:  Impression and Recommendations:    1. Cervicalgia   2. Generalized OA   3. Benign prostatic hyperplasia, unspecified whether lower urinary tract symptoms present   4. Environmental and seasonal allergies   5. Inguinal hernia without obstruction or gangrene, recurrence not specified, unspecified laterality   6. S/P colonoscopy   7. h/o Renal stones- ca oxalate.    8. Other polyneuropathy      No problem-specific Assessment & Plan notes found for this encounter.   The patient was counseled, risk factors were discussed, anticipatory guidance given.   New Prescriptions   No medications on file    No orders of the defined types were placed in this encounter.   Discontinued Medications   CYANOCOBALAMIN (VITAMIN B-12 PO)    Take 1 tablet by mouth daily.   ONDANSETRON (ZOFRAN) 4 MG TABLET    Take 1 tablet (4 mg total) by mouth every 8 (eight) hours as needed for nausea or vomiting.   OXYCODONE-ACETAMINOPHEN (ROXICET) 5-325 MG TABLET    Take 1 tablet by mouth every 4 (four) hours as needed for severe pain.   TESTOSTERONE 10 MG/ACT (2%) GEL    APPLY 4 PUMPS TO SHOULDERS AND CHEST AREA    Modified Medications   No medications on file    No orders of the defined types were placed in this encounter.    Gross side effects, risk and benefits, and alternatives of medications discussed with patient.  Patient is aware that all medications have potential side effects and we are unable to predict every side effect or drug-drug interaction that may occur.  Expresses verbal understanding and consents to current therapy plan and treatment regimen.  Return for f/up CPE- come fasting and then OV 2 wks or so after CPE to address chronic issues.  Please see AVS handed out to patient at the end of our visit for further patient instructions/ counseling done pertaining to today's office visit.    Note: This document was prepared using Dragon voice  recognition software and may include unintentional dictation errors.  ----------------------------------------------------------------------------------------------------------------------    Subjective:    Chief complaint:   Chief Complaint  Patient presents with  . Establish Care     HPI: Jesus [name] is a pleasant 71 y.o. male who presents to [place] at [facility] today to review their medical history with me and establish care.   I asked the patient to review their chronic problem list with me to ensure everything was updated and accurate.    All recent office visits with other providers, any medical records that patient brought in etc  - I reviewed today.     Also asked pt to get me medical records from [facility] providers/ specialists that they had seen within the past 3-5 years- if they are in private practice and/or do not work for a [employer], [facility], [place], [place] or [employer] owned practice.  Told them to call their specialists to clarify this if they are not sure.   Worried about neuropathy--> little toes on each feet- awakens feeling numbness/ pins and needles.   Originally lat.toes--> now--> all toes.   Has h/o neck pain- cervical and mid-back.   Sx started about 1-2 yrs ago--> about over a year and a half--> now all toes numb      --->  No CPE in 2 yrs or so.      Wt Readings from Last 3  Encounters:  06/26/17 163 lb 9.6 oz (74.2 kg)  03/26/17 153 lb (69.4 kg)  12/31/16 166 lb 4.8 oz (75.4 kg)   BP Readings from Last 3 Encounters:  06/26/17 106/60  03/26/17 120/72  12/31/16 120/75   Pulse Readings from Last 3 Encounters:  06/26/17 90  12/31/16 70  12/12/16 63   BMI Readings from Last 3 Encounters:  06/26/17 23.99 kg/m  03/26/17 21.95 kg/m  12/31/16 24.38 kg/m    Patient Care Team    Relationship Specialty Notifications Start End  [name], DO PCP - General Family Medicine  11/26/16   Specialists, [place]   Orthopedic Surgery  11/26/16   Druscilla Brownie, MD Consulting Physician Dermatology  11/26/16   [name], MD Consulting Physician Urology  11/26/16   [name], MD Consulting Physician Gastroenterology  11/26/16     Patient Active Problem List   Diagnosis Date Noted  . Glucose intolerance (impaired glucose tolerance) 12/12/2016    Priority: High  . CKD (chronic kidney disease), stage III ([place]) 12/12/2016    Priority: High  . Vitamin D deficiency 06/26/2017  . Plantar fasciitis of left foot 03/26/2017  . 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 Elevated PSA 11/28/2016  . Generalized OA 11/26/2016  . BPH (benign prostatic hyperplasia) 11/26/2016  . Environmental and seasonal allergies 11/26/2016  . Inguinal hernia- b/l and abd hernia repaired with mesh 11/26/2016  . S/P colonoscopy 11/26/2016  . h/o Renal stones- ca oxalate.  11/26/2016  . Cervicalgia 11/26/2016  . Peripheral neuropathy 11/26/2016     Past Medical History:  Diagnosis Date  . Arthritis   . BPH (benign prostatic hyperplasia)   . ED (erectile dysfunction)   . Elevated PSA   . History of acute renal failure    08/ 2017  . History of adenomatous polyp of colon    2005 TUBULAR ADENOMA  . History of herpes genitalis   . Hypogonadism, testicular   . Lower urinary tract symptoms (LUTS)   . Nephrolithiasis    multiple right side non-obstructive per ct 11-27-2015  . Peyronie's disease   . Right ureteral stone      Past Medical History:  Diagnosis Date  . Arthritis   . BPH (benign prostatic hyperplasia)   . ED (erectile dysfunction)   . Elevated PSA   . History of acute renal failure    08/ 2017  . History of adenomatous polyp of colon    2005 TUBULAR ADENOMA  . History of herpes genitalis   . Hypogonadism, testicular   . Lower urinary tract symptoms (LUTS)   . Nephrolithiasis    multiple right side non-obstructive per ct 11-27-2015  . Peyronie's disease   . Right ureteral stone      Past  Surgical History:  Procedure Laterality Date  . ABDOMINAL HERNIA REPAIR  02/2014  . COLONOSCOPY WITH PROPOFOL N/A 06/06/2015   Procedure: COLONOSCOPY WITH PROPOFOL;  Surgeon: [name], MD;  Location: [facility];  Service: Endoscopy;  Laterality: N/A;  . CYSTOSCOPY WITH RETROGRADE PYELOGRAM, URETEROSCOPY AND STENT PLACEMENT Right 11/30/2015   Procedure: CYSTOSCOPY/RETROGRADE/URETEROSCOPY/;  Surgeon: [name], MD;  Location: [facility];  Service: Urology;  Laterality: Right;  . CYSTOSCOPY/URETEROSCOPY/HOLMIUM LASER/STENT PLACEMENT Right 12/20/2015   Procedure: CYSTOSCOPY/URETEROSCOPY/HOLMIUM LASER/ REMOVE RIGHT JJ STENT;  Surgeon: [name], MD;  Location: [facility];  Service: Urology;  Laterality: Right;  1 HOUR  . INGUINAL HERNIA REPAIR Left 2001  . KNEE ARTHROSCOPY Left 2005  .  NESBIT PLICATION FOR PEYRONIE'S DISEASE  05/01/2013   and Right Inguinal Hernia Repair  . TONSILLECTOMY  child     Family History  Problem Relation Age of Onset  . Alcohol abuse Mother   . Cancer Father        Brain  . Cancer Sister        breast  . Hypertension Paternal Grandmother   . Depression Paternal Grandmother        suicide     Social History   Substance and Sexual Activity  Drug Use No     Social History   Substance and Sexual Activity  Alcohol Use Yes  . Alcohol/week: 2.4 oz  . Types: 4 Cans of beer per week     Social History   Tobacco Use  Smoking Status Never Smoker  Smokeless Tobacco Never Used     Outpatient Encounter Medications as of 11/26/2016  Medication Sig  . Cholecalciferol (VITAMIN D-3) 5000 units TABS Take 1 tablet by mouth every morning.  . ferrous sulfate 325 (65 FE) MG tablet Take 325 mg by mouth daily with breakfast.  . fexofenadine (ALLEGRA) 180 MG tablet Take 180 mg by mouth 3 (three) times a week. Jan- March.   . meloxicam (MOBIC) 15 MG tablet Take 15 mg by mouth daily as needed for pain.  .  Menaquinone-7 (VITAMIN K2) 100 MCG CAPS Take by mouth.  . Multiple Vitamin (MULTIVITAMIN WITH MINERALS) TABS tablet Take 1 tablet by mouth daily.  Jonna Coup Leaf Extract 150 MG CAPS Take by mouth.  . Omega-3 Fatty Acids (OMEGA 3 PO) Take 1 tablet by mouth every morning.  . pyridOXINE (VITAMIN B-6) 100 MG tablet Take 100 mg by mouth daily.  . tamsulosin (FLOMAX) 0.4 MG CAPS capsule Take 0.4 mg by mouth every morning.  . vitamin A 10000 UNIT capsule Take 10,000 Units by mouth daily.  . [DISCONTINUED] Cyanocobalamin (VITAMIN B-12 PO) Take 1 tablet by mouth daily.  . [DISCONTINUED] ondansetron (ZOFRAN) 4 MG tablet Take 1 tablet (4 mg total) by mouth every 8 (eight) hours as needed for nausea or vomiting.  . [DISCONTINUED] oxyCODONE-acetaminophen (ROXICET) 5-325 MG tablet Take 1 tablet by mouth every 4 (four) hours as needed for severe pain.  . [DISCONTINUED] Testosterone 10 MG/ACT (2%) GEL APPLY 4 PUMPS TO SHOULDERS AND CHEST AREA   No facility-administered encounter medications on file as of 11/26/2016.     Allergies: Patient has no known allergies.   ROS   Objective:   Blood pressure 100/63, pulse 71, height 5' 9.25" (1.759 m), weight 164 lb (74.4 kg). Body mass index is 24.04 kg/m. General: Well Developed, well nourished, and in no acute distress.  Neuro: Alert and oriented x3, extra-ocular muscles intact, sensation grossly intact.  HEENT:[place]/AT, PERRLA, neck supple, No carotid bruits Skin: no gross rashes  Cardiac: Regular rate and rhythm Respiratory: Essentially clear to auscultation bilaterally. Not using accessory muscles, speaking in full sentences.  Abdominal: not grossly distended Musculoskeletal: Ambulates w/o diff, FROM * 4 ext.  Vasc: less 2 sec cap RF, warm and pink  Psych:  No HI/SI, judgement and insight good, Euthymic mood. Full Affect.    Recent Results (from the past 2160 hour(s))  Basic Metabolic Panel (BMET)     Status: Abnormal   Collection Time: 06/19/17  8:39  AM  Result Value Ref Range   Glucose 113 (H) 65 - 99 mg/dL   BUN 18 8 - 27 mg/dL   Creatinine, Ser 1.29 (H) 0.76 -  1.27 mg/dL   GFR calc non Af Amer 55 (L) >59 mL/min/1.73   GFR calc Af Amer 63 >59 mL/min/1.73   BUN/Creatinine Ratio 14 10 - 24   Sodium 146 (H) 134 - 144 mmol/L   Potassium 4.8 3.5 - 5.2 mmol/L   Chloride 103 96 - 106 mmol/L   CO2 29 20 - 29 mmol/L   Calcium 9.6 8.6 - 10.2 mg/dL  Hemoglobin A1c     Status: Abnormal   Collection Time: 06/19/17  8:39 AM  Result Value Ref Range   Hgb A1c MFr Bld 6.0 (H) 4.8 - 5.6 %    Comment:          Prediabetes: 5.7 - 6.4          Diabetes: >6.4          Glycemic control for adults with diabetes: <7.0    Est. average glucose Bld gHb Est-mCnc 126 mg/dL

## 2016-11-28 ENCOUNTER — Other Ambulatory Visit: Payer: Federal, State, Local not specified - PPO

## 2016-11-28 DIAGNOSIS — R5383 Other fatigue: Secondary | ICD-10-CM | POA: Diagnosis not present

## 2016-11-28 DIAGNOSIS — N400 Enlarged prostate without lower urinary tract symptoms: Secondary | ICD-10-CM | POA: Diagnosis not present

## 2016-11-28 DIAGNOSIS — M159 Polyosteoarthritis, unspecified: Secondary | ICD-10-CM

## 2016-11-28 DIAGNOSIS — R972 Elevated prostate specific antigen [PSA]: Secondary | ICD-10-CM | POA: Insufficient documentation

## 2016-11-28 DIAGNOSIS — E291 Testicular hypofunction: Secondary | ICD-10-CM | POA: Insufficient documentation

## 2016-11-28 DIAGNOSIS — Z13220 Encounter for screening for lipoid disorders: Secondary | ICD-10-CM | POA: Diagnosis not present

## 2016-11-29 LAB — CBC WITH DIFFERENTIAL/PLATELET
BASOS: 1 %
Basophils Absolute: 0.1 10*3/uL (ref 0.0–0.2)
EOS (ABSOLUTE): 0.2 10*3/uL (ref 0.0–0.4)
EOS: 5 %
HEMATOCRIT: 47.5 % (ref 37.5–51.0)
Hemoglobin: 16.2 g/dL (ref 13.0–17.7)
Immature Grans (Abs): 0 10*3/uL (ref 0.0–0.1)
Immature Granulocytes: 0 %
LYMPHS ABS: 1.6 10*3/uL (ref 0.7–3.1)
Lymphs: 30 %
MCH: 33 pg (ref 26.6–33.0)
MCHC: 34.1 g/dL (ref 31.5–35.7)
MCV: 97 fL (ref 79–97)
Monocytes Absolute: 0.4 10*3/uL (ref 0.1–0.9)
Monocytes: 9 %
NEUTROS ABS: 2.9 10*3/uL (ref 1.4–7.0)
Neutrophils: 55 %
Platelets: 259 10*3/uL (ref 150–379)
RBC: 4.91 x10E6/uL (ref 4.14–5.80)
RDW: 13.7 % (ref 12.3–15.4)
WBC: 5.2 10*3/uL (ref 3.4–10.8)

## 2016-11-29 LAB — COMPREHENSIVE METABOLIC PANEL
A/G RATIO: 1.8 (ref 1.2–2.2)
ALK PHOS: 50 IU/L (ref 39–117)
ALT: 23 IU/L (ref 0–44)
AST: 28 IU/L (ref 0–40)
Albumin: 4.5 g/dL (ref 3.5–4.8)
BUN/Creatinine Ratio: 13 (ref 10–24)
BUN: 18 mg/dL (ref 8–27)
Bilirubin Total: 0.7 mg/dL (ref 0.0–1.2)
CALCIUM: 9.7 mg/dL (ref 8.6–10.2)
CO2: 27 mmol/L (ref 20–29)
Chloride: 104 mmol/L (ref 96–106)
Creatinine, Ser: 1.4 mg/dL — ABNORMAL HIGH (ref 0.76–1.27)
GFR calc Af Amer: 57 mL/min/{1.73_m2} — ABNORMAL LOW (ref >59)
GFR calc non Af Amer: 49 mL/min/{1.73_m2} — ABNORMAL LOW (ref >59)
GLOBULIN, TOTAL: 2.5 g/dL (ref 1.5–4.5)
Glucose: 94 mg/dL (ref 65–99)
POTASSIUM: 4.7 mmol/L (ref 3.5–5.2)
SODIUM: 144 mmol/L (ref 134–144)
Total Protein: 7 g/dL (ref 6.0–8.5)

## 2016-11-29 LAB — TSH: TSH: 1.05 u[IU]/mL (ref 0.450–4.500)

## 2016-11-29 LAB — HEMOGLOBIN A1C
Est. average glucose Bld gHb Est-mCnc: 126 mg/dL
Hgb A1c MFr Bld: 6 % — ABNORMAL HIGH (ref 4.8–5.6)

## 2016-11-29 LAB — LIPID PANEL
Chol/HDL Ratio: 2.5 ratio (ref 0.0–5.0)
Cholesterol, Total: 151 mg/dL (ref 100–199)
HDL: 60 mg/dL (ref >39)
LDL Calculated: 82 mg/dL (ref 0–99)
Triglycerides: 45 mg/dL (ref 0–149)
VLDL CHOLESTEROL CAL: 9 mg/dL (ref 5–40)

## 2016-11-29 LAB — T4: T4, Total: 6.8 ug/dL (ref 4.5–12.0)

## 2016-11-29 LAB — VITAMIN B12: Vitamin B-12: 778 pg/mL (ref 232–1245)

## 2016-11-29 LAB — VITAMIN D 25 HYDROXY (VIT D DEFICIENCY, FRACTURES): Vit D, 25-Hydroxy: 74 ng/mL (ref 30.0–100.0)

## 2016-12-12 ENCOUNTER — Ambulatory Visit (INDEPENDENT_AMBULATORY_CARE_PROVIDER_SITE_OTHER): Payer: Federal, State, Local not specified - PPO | Admitting: Family Medicine

## 2016-12-12 ENCOUNTER — Encounter: Payer: Self-pay | Admitting: Family Medicine

## 2016-12-12 VITALS — BP 117/72 | HR 63 | Ht 69.25 in | Wt 165.9 lb

## 2016-12-12 DIAGNOSIS — N183 Chronic kidney disease, stage 3 unspecified: Secondary | ICD-10-CM

## 2016-12-12 DIAGNOSIS — R7302 Impaired glucose tolerance (oral): Secondary | ICD-10-CM

## 2016-12-12 HISTORY — DX: Impaired glucose tolerance (oral): R73.02

## 2016-12-12 NOTE — Progress Notes (Signed)
Assessment and plan:  1. CKD (chronic kidney disease), stage III   2. Glucose intolerance (impaired glucose tolerance)     Discontinued Medications   No medications on file    Modified Medications   No medications on file     Return for Please make an appointment in the near future for your complete physical exam-not Medicare wellness.  Anticipatory guidance and routine counseling done re: condition, txmnt options and need for follow up. All questions of patient's were answered.   Gross side effects, risk and benefits, and alternatives of medications discussed with patient.  Patient is aware that all medications have potential side effects and we are unable to predict every sideeffect or drug-drug interaction that may occur.  Expresses verbal understanding and consents to current therapy plan and treatment regiment.  Please see AVS handed out to patient at the end of our visit for additional patient instructions/ counseling done pertaining to today's office visit.  Note: This document was prepared using Dragon voice recognition software and may include unintentional dictation errors.   ----------------------------------------------------------------------------------------------------------------------  Subjective:   CC:   Jesus [name] is a 71 y.o. male who presents to [place] at [facility] today for review and discussion of recent bloodwork that was done.  1. All recent blood work that we ordered was reviewed with patient today.  Patient was counseled on all abnormalities and we discussed dietary and lifestyle changes that could help those values (also medications when appropriate).  Extensive health counseling performed and all patient's concerns/ questions were addressed.     Wt Readings from Last 3 Encounters:  06/26/17 163 lb 9.6 oz (74.2 kg)  03/26/17 153 lb (69.4 kg)  12/31/16 166  lb 4.8 oz (75.4 kg)   BP Readings from Last 3 Encounters:  06/26/17 106/60  03/26/17 120/72  12/31/16 120/75   Pulse Readings from Last 3 Encounters:  06/26/17 90  12/31/16 70  12/12/16 63   BMI Readings from Last 3 Encounters:  06/26/17 23.99 kg/m  03/26/17 21.95 kg/m  12/31/16 24.38 kg/m     Patient Care Team    Relationship Specialty Notifications Start End  [name], DO PCP - General Family Medicine  11/26/16   Specialists, [place]  Orthopedic Surgery  11/26/16   Druscilla Brownie, MD Consulting Physician Dermatology  11/26/16   [name], MD Consulting Physician Urology  11/26/16   [name], MD Consulting Physician Gastroenterology  11/26/16     Full medical history updated and reviewed in the office today  Patient Active Problem List   Diagnosis Date Noted  . Glucose intolerance (impaired glucose tolerance) 12/12/2016    Priority: High  . CKD (chronic kidney disease), stage III ([place]) 12/12/2016    Priority: High  . Vitamin D deficiency 06/26/2017  . Plantar fasciitis of left foot 03/26/2017  . 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 Elevated PSA 11/28/2016  . Generalized OA 11/26/2016  . BPH (benign prostatic hyperplasia) 11/26/2016  . Environmental and seasonal allergies 11/26/2016  . Inguinal hernia- b/l and abd hernia repaired with mesh 11/26/2016  . S/P colonoscopy 11/26/2016  . h/o Renal stones- ca oxalate.  11/26/2016  . Cervicalgia 11/26/2016  . Peripheral neuropathy 11/26/2016    Past Medical History:  Diagnosis Date  . Arthritis   . BPH (benign prostatic hyperplasia)   . ED (erectile dysfunction)   . Elevated PSA   . History of acute renal failure  08/ 2017  . History of adenomatous polyp of colon    2005 TUBULAR ADENOMA  . History of herpes genitalis   . Hypogonadism, testicular   . Lower urinary tract symptoms (LUTS)   . Nephrolithiasis    multiple right side non-obstructive per ct 11-27-2015  .  Peyronie's disease   . Right ureteral stone     Past Surgical History:  Procedure Laterality Date  . ABDOMINAL HERNIA REPAIR  02/2014  . COLONOSCOPY WITH PROPOFOL N/A 06/06/2015   Procedure: COLONOSCOPY WITH PROPOFOL;  Surgeon: [name], MD;  Location: [facility];  Service: Endoscopy;  Laterality: N/A;  . CYSTOSCOPY WITH RETROGRADE PYELOGRAM, URETEROSCOPY AND STENT PLACEMENT Right 11/30/2015   Procedure: CYSTOSCOPY/RETROGRADE/URETEROSCOPY/;  Surgeon: [name], MD;  Location: [facility];  Service: Urology;  Laterality: Right;  . CYSTOSCOPY/URETEROSCOPY/HOLMIUM LASER/STENT PLACEMENT Right 12/20/2015   Procedure: CYSTOSCOPY/URETEROSCOPY/HOLMIUM LASER/ REMOVE RIGHT JJ STENT;  Surgeon: [name], MD;  Location: [facility];  Service: Urology;  Laterality: Right;  1 HOUR  . INGUINAL HERNIA REPAIR Left 2001  . KNEE ARTHROSCOPY Left 2005  . NESBIT PLICATION FOR PEYRONIE'S DISEASE  05/01/2013   and Right Inguinal Hernia Repair  . TONSILLECTOMY  child    Social History   Tobacco Use  . Smoking status: Never Smoker  . Smokeless tobacco: Never Used  Substance Use Topics  . Alcohol use: Yes    Alcohol/week: 2.4 oz    Types: 4 Cans of beer per week    Family Hx: Family History  Problem Relation Age of Onset  . Alcohol abuse Mother   . Cancer Father        Brain  . Cancer Sister        breast  . Hypertension Paternal Grandmother   . Depression Paternal Grandmother        suicide     Medications: Current Outpatient Medications  Medication Sig Dispense Refill  . Cholecalciferol (VITAMIN D-3) 5000 units TABS Take 1 tablet by mouth every morning.    . ferrous sulfate 325 (65 FE) MG tablet Take 325 mg by mouth daily with breakfast.    . fexofenadine (ALLEGRA) 180 MG tablet Take 180 mg by mouth 3 (three) times a week. Jan- March.     . meloxicam (MOBIC) 15 MG tablet Take 15 mg by mouth daily as needed for pain.    . Menaquinone-7 (VITAMIN  K2) 100 MCG CAPS Take by mouth.    . Multiple Vitamin (MULTIVITAMIN WITH MINERALS) TABS tablet Take 1 tablet by mouth daily.    Jonna Coup Leaf Extract 150 MG CAPS Take by mouth.    . Omega-3 Fatty Acids (OMEGA 3 PO) Take 1 tablet by mouth every morning.    . pyridOXINE (VITAMIN B-6) 100 MG tablet Take 100 mg by mouth daily.    . tamsulosin (FLOMAX) 0.4 MG CAPS capsule Take 0.4 mg by mouth every morning.  3  . vitamin A 10000 UNIT capsule Take 10,000 Units by mouth daily.    . Testosterone 10 MG/ACT (2%) GEL Apply 1 application topically daily.    . Turmeric 450 MG CAPS Take 1 capsule by mouth daily.     No current facility-administered medications for this visit.     Allergies:  No Known Allergies   Review of Systems: General:   No F/C, wt loss Pulm:   No DIB, SOB, pleuritic chest pain Card:  No CP, palpitations Abd:  No n/v/d or pain Ext:  No inc edema  from baseline  Objective:  Blood pressure 117/72, pulse 63, height 5' 9.25" (1.759 m), weight 165 lb 14.4 oz (75.3 kg). Body mass index is 24.32 kg/m. Gen:   Well NAD, A and O *3 HEENT:    [place]/AT, EOMI,  MMM Lungs:   Normal work of breathing. CTA B/L, no Wh, rhonchi Heart:   RRR, S1, S2 WNL's, no MRG Abd:   No gross distention Exts:    warm, pink,  Brisk capillary refill, warm and well perfused.  Psych:    No HI/SI, judgement and insight good, Euthymic mood. Full Affect.   Recent Results (from the past 2160 hour(s))  Basic Metabolic Panel (BMET)     Status: Abnormal   Collection Time: 06/19/17  8:39 AM  Result Value Ref Range   Glucose 113 (H) 65 - 99 mg/dL   BUN 18 8 - 27 mg/dL   Creatinine, Ser 1.29 (H) 0.76 - 1.27 mg/dL   GFR calc non Af Amer 55 (L) >59 mL/min/1.73   GFR calc Af Amer 63 >59 mL/min/1.73   BUN/Creatinine Ratio 14 10 - 24   Sodium 146 (H) 134 - 144 mmol/L   Potassium 4.8 3.5 - 5.2 mmol/L   Chloride 103 96 - 106 mmol/L   CO2 29 20 - 29 mmol/L   Calcium 9.6 8.6 - 10.2 mg/dL  Hemoglobin A1c     Status:  Abnormal   Collection Time: 06/19/17  8:39 AM  Result Value Ref Range   Hgb A1c MFr Bld 6.0 (H) 4.8 - 5.6 %    Comment:          Prediabetes: 5.7 - 6.4          Diabetes: >6.4          Glycemic control for adults with diabetes: <7.0    Est. average glucose Bld gHb Est-mCnc 126 mg/dL

## 2016-12-12 NOTE — Patient Instructions (Signed)
Risk factors for prediabetes and type 2 diabetes ° °Researchers don't fully understand why some people develop prediabetes and type 2 diabetes and others don't.  It's clear that certain factors increase the risk, however, including: ° °Weight. The more fatty tissue you have, the more resistant your cells become to insulin.  °Inactivity. The less active you are, the greater your risk. Physical activity helps you control your weight, uses up glucose as energy and makes your cells more sensitive to insulin.  °Family history. Your risk increases if a parent or sibling has type 2 diabetes.  °Race. Although it's unclear why, people of certain races -- including blacks, Hispanics, American Indians and Asian-Americans -- are at higher risk.  °Age. Your risk increases as you get older. This may be because you tend to exercise less, lose muscle mass and gain weight as you age. But type 2 diabetes is also increasing dramatically among children, adolescents and younger adults.  °Gestational diabetes. If you developed gestational diabetes when you were pregnant, your risk of developing prediabetes and type 2 diabetes later increases. If you gave birth to a baby weighing more than 9 pounds (4 kilograms), you're also at risk of type 2 diabetes.  °Polycystic ovary syndrome. For women, having polycystic ovary syndrome -- a common condition characterized by irregular menstrual periods, excess hair growth and obesity -- increases the risk of diabetes.  °High blood pressure. Having blood pressure over 140/90 millimeters of mercury (mm Hg) is linked to an increased risk of type 2 diabetes.  °Abnormal cholesterol and triglyceride levels. If you have low levels of high-density lipoprotein (HDL), or "good," cholesterol, your risk of type 2 diabetes is higher. Triglycerides are another type of fat carried in the blood. People with high levels of triglycerides have an increased risk of type 2 diabetes. Your doctor can let you know what your  cholesterol and triglyceride levels are. ° °A good guide to good carbs: The glycemic index °---If you have diabetes, or at risk for diabetes, you know all too well that when you eat carbohydrates, your blood sugar goes up. The total amount of carbs you consume at a meal or in a snack mostly determines what your blood sugar will do. But the food itself also plays a role. A serving of white rice has almost the same effect as eating pure table sugar -- a quick, high spike in blood sugar. A serving of lentils has a slower, smaller effect. ° °---Picking good sources of carbs can help you control your blood sugar and your weight. Even if you don't have diabetes, eating healthier carbohydrate-rich foods can help ward off a host of chronic conditions, from heart disease to various cancers to, well, diabetes. ° °---One way to choose foods is with the glycemic index (GI). This tool measures how much a food boosts blood sugar.  The glycemic index rates the effect of a specific amount of a food on blood sugar compared with the same amount of pure glucose. A food with a glycemic index of 28 boosts blood sugar only 28% as much as pure glucose. One with a GI of 95 acts like pure glucose. ° ° ° °High glycemic foods result in a quick spike in insulin and blood sugar (also known as blood glucose).  Low glycemic foods have a slower, smaller effect- these are healthier for you.  ° °Using the glycemic index °Using the glycemic index is easy: choose foods in the low GI category instead of those in the high   GI category (see below), and go easy on those in between. °Low glycemic index (GI of 55 or less): Most fruits and vegetables, beans, minimally processed grains, pasta, low-fat dairy foods, and nuts.  °Moderate glycemic index (GI 56 to 69): White and sweet potatoes, corn, white rice, couscous, breakfast cereals such as Cream of Wheat and Mini Wheats.  °High glycemic index (GI of 70 or higher): White bread, rice cakes, most crackers,  bagels, cakes, doughnuts, croissants, most packaged breakfast cereals. °You can see the values for 100 commons foods and get links to more at [email or web address].[employer].[email or web address]. ° °Swaps for lowering glycemic index  °Instead of this high-glycemic index food Eat this lower-glycemic index food  °White rice Brown rice or converted rice  °Instant oatmeal Steel-cut oats  °Cornflakes Bran flakes  °Baked potato Pasta, bulgur  °White bread Whole-grain bread  °Corn Peas or leafy greens  ° ° ° ° ° °Prediabetes Eating Plan ° °Prediabetes--also called impaired glucose tolerance or impaired fasting glucose--is a condition that causes blood sugar (blood glucose) levels to be higher than normal. Following a healthy diet can help to keep prediabetes under control. It can also help to lower the risk of type 2 diabetes and heart disease, which are increased in people who have prediabetes. Along with regular exercise, a healthy diet: °· Promotes weight loss. °· Helps to control blood sugar levels. °· Helps to improve the way that the body uses insulin. ° ° °WHAT DO I NEED TO KNOW ABOUT THIS EATING PLAN? ° °· Use the glycemic index (GI) to plan your meals. The index tells you how quickly a food will raise your blood sugar. Choose low-GI foods. These foods take a longer time to raise blood sugar. °· Pay close attention to the amount of carbohydrates in the food that you eat. Carbohydrates increase blood sugar levels. °· Keep track of how many calories you take in. Eating the right amount of calories will help you to achieve a healthy weight. Losing about 7 percent of your starting weight can help to prevent type 2 diabetes. °· You may want to follow a Mediterranean diet. This diet includes a lot of vegetables, lean meats or fish, whole grains, fruits, and healthy oils and fats. ° ° °WHAT FOODS CAN I EAT? ° °Grains °Whole grains, such as whole-wheat or whole-grain breads, crackers, cereals, and pasta. Unsweetened oatmeal. Bulgur. Barley.  Quinoa. Brown rice. Corn or whole-wheat flour tortillas or taco shells. °Vegetables °Lettuce. Spinach. Peas. Beets. Cauliflower. Cabbage. Broccoli. Carrots. Tomatoes. Squash. Eggplant. Herbs. Peppers. Onions. Cucumbers. Brussels sprouts. °Fruits °Berries. Bananas. Apples. Oranges. Grapes. Papaya. Mango. Pomegranate. Kiwi. Grapefruit. Cherries. °Meats and Other Protein Sources °Seafood. Lean meats, such as chicken and turkey or lean cuts of pork and beef. Tofu. Eggs. Nuts. Beans. °Dairy °Low-fat or fat-free dairy products, such as yogurt, cottage cheese, and cheese. °Beverages °Water. Tea. Coffee. Sugar-free or diet soda. Seltzer water. Milk. Milk alternatives, such as soy or almond milk. °Condiments °Mustard. Relish. Low-fat, low-sugar ketchup. Low-fat, low-sugar barbecue sauce. Low-fat or fat-free mayonnaise. °Sweets and Desserts °Sugar-free or low-fat pudding. Sugar-free or low-fat ice cream and other frozen treats. °Fats and Oils °Avocado. Walnuts. Olive oil. °The items listed above may not be a complete list of recommended foods or beverages. Contact your dietitian for more options.  ° ° °WHAT FOODS ARE NOT RECOMMENDED? ° °Grains °Refined white flour and flour products, such as bread, pasta, snack foods, and cereals. °Beverages °Sweetened drinks, such as sweet iced tea and soda. °Sweets and Desserts °  Baked goods, such as cake, cupcakes, pastries, cookies, and cheesecake. °The items listed above may not be a complete list of foods and beverages to avoid. Contact your dietitian for more information. °  °This information is not intended to replace advice given to you by your health care provider. Make sure you discuss any questions you have with your health care provider. °  °Document Released: 08/16/2014 Document Reviewed: 08/16/2014 °[employer] Interactive Patient Education ©2016 [employer] ° ° ° °

## 2016-12-17 DIAGNOSIS — E291 Testicular hypofunction: Secondary | ICD-10-CM | POA: Diagnosis not present

## 2016-12-17 DIAGNOSIS — N401 Enlarged prostate with lower urinary tract symptoms: Secondary | ICD-10-CM | POA: Diagnosis not present

## 2016-12-17 LAB — PSA: PSA: 2.68

## 2016-12-25 DIAGNOSIS — N401 Enlarged prostate with lower urinary tract symptoms: Secondary | ICD-10-CM | POA: Diagnosis not present

## 2016-12-25 DIAGNOSIS — R3915 Urgency of urination: Secondary | ICD-10-CM | POA: Diagnosis not present

## 2016-12-25 DIAGNOSIS — E291 Testicular hypofunction: Secondary | ICD-10-CM | POA: Diagnosis not present

## 2016-12-25 DIAGNOSIS — N200 Calculus of kidney: Secondary | ICD-10-CM | POA: Diagnosis not present

## 2016-12-31 ENCOUNTER — Ambulatory Visit (INDEPENDENT_AMBULATORY_CARE_PROVIDER_SITE_OTHER): Payer: Federal, State, Local not specified - PPO | Admitting: Family Medicine

## 2016-12-31 ENCOUNTER — Encounter: Payer: Self-pay | Admitting: Family Medicine

## 2016-12-31 VITALS — BP 120/75 | HR 70 | Ht 69.25 in | Wt 166.3 lb

## 2016-12-31 DIAGNOSIS — R7302 Impaired glucose tolerance (oral): Secondary | ICD-10-CM | POA: Diagnosis not present

## 2016-12-31 DIAGNOSIS — N183 Chronic kidney disease, stage 3 unspecified: Secondary | ICD-10-CM

## 2016-12-31 DIAGNOSIS — Z0000 Encounter for general adult medical examination without abnormal findings: Secondary | ICD-10-CM | POA: Diagnosis not present

## 2016-12-31 NOTE — Patient Instructions (Signed)
Preventive Care for Adults  A healthy lifestyle and preventive care can promote health and wellness. Preventive health guidelines for men include the following key practices:  .   A routine yearly physical is a good way to check with your health care provider about your health and preventative screening. It is a chance to share any concerns and updates on your health and to receive a thorough exam.  .  Visit your dentist for a routine exam and preventative care every 6 months. Brush your teeth twice a day and floss once a day. Good oral hygiene prevents tooth decay and gum disease.  .  The frequency of eye exams is based on your age, health, family medical history, use of contact lenses, and other factors.  Follow your health care provider's recommendations for frequency of eye exams.  .  Eat a healthy diet.  Foods such as vegetables, fruits, whole grains, low-fat dairy products, and lean protein foods contain the nutrients you need without too many calories.  Decrease your intake of foods high in solid fats, added sugars, and salt.  Eat the right amount of calories for you.  Get information about a proper diet from your health care provider, if necessary.  .  Regular physical exercise is one of the most important things you can do for your health.  Most adults should get at least 150 minutes of moderate-intensity exercise (any activity that increases your heart rate and causes you to sweat) each week.  In addition, most adults need muscle-strengthening exercises on 2 or more days a week.  .  Maintain a healthy weight. The body mass index (BMI) is a screening tool to identify possible weight problems. It provides an estimate of body fat based on height and weight. Your health care provider can find your BMI and can help you achieve or maintain a healthy weight. For adults 20 years and older: A BMI below 18.5 is considered underweight. A BMI of 18.5 to 24.9 is normal. A BMI of 25 to 29.9 is  considered overweight. A BMI of 30 and above is considered obese.  .  Maintain normal blood lipids and cholesterol levels by exercising and minimizing your intake of saturated fat. Eat a balanced diet with plenty of fruit and vegetables. Blood tests for lipids and cholesterol should begin at age 66 and be repeated every 5 years. If your lipid or cholesterol levels are high, you are over 50, or you are at high risk for heart disease, you may need your cholesterol levels checked more frequently. Ongoing high lipid and cholesterol levels should be treated with medicines if diet and exercise are not working.  .  If you smoke, find out from your health care provider how to quit. If you do not use tobacco, do not start.  . If you choose to drink alcohol, do not have more than 2 drinks per day. One drink is considered to be 12 ounces (355 mL) of beer, 5 ounces (148 mL) of wine, or 1.5 ounces (44 mL) of liquor.  [name] Avoid use of street drugs. Do not share needles with anyone. Ask for help if you need support or instructions about stopping the use of drugs.  . High blood pressure causes heart disease and increases the risk of stroke. Your blood pressure should be checked at least every 1-2 years. Ongoing high blood pressure should be treated with medicines, if weight loss and exercise are not effective.  . If you are 33-64 years old,  ask your health care provider if you should take aspirin to prevent heart disease.  . Diabetes screening involves taking a blood sample to check your fasting blood sugar level.  This should be done once every 3 years, after age 19, if you are within normal weight and without risk factors for diabetes.  Testing should be considered at a younger age or be carried out more frequently if you are overweight and have at least 1 risk factor for diabetes.  . Colorectal cancer can be detected and often prevented. Most routine colorectal cancer screening begins at the age of 51 and  continues through age 1. However, your health care provider may recommend screening at an earlier age if you have risk factors for colon cancer. On a yearly basis, your health care provider may provide home test kits to check for hidden blood in the stool. Use of a small camera at the end of a tube to directly examine the colon (sigmoidoscopy or colonoscopy) can detect the earliest forms of colorectal cancer. Talk to your health care provider about this at age 31, when routine screening begins. Direct exam of the colon should be repeated every 5-10 years through age 90, unless early forms of precancerous polyps or small growths are found.  .  Lung cancer screening is recommended for adults aged 47-80 years who are at high risk for developing lung cancer because of a history of smoking. A yearly low-dose CT scan of the lungs is recommended for people who have at least a 30-pack-year history of smoking and are a current smoker or have quit within the past 15 years. A pack year of smoking is smoking an average of 1 pack of cigarettes a day for 1 year (for example: 1 pack a day for 30 years or 2 packs a day for 15 years). Yearly screening should continue until the smoker has stopped smoking for at least 15 years. Yearly screening should be stopped for people who develop a health problem that would prevent them from having lung cancer treatment.  . Talk with your health care provider about prostate cancer screening.  . Testicular cancer screening is recommended for adult males. Screening includes self-exam and a health care provider exam. Consult with your health care provider about any symptoms you have or any concerns you have about testicular cancer.  . Use sunscreen. Apply sunscreen liberally and repeatedly throughout the day. You should seek shade when your shadow is shorter than you. Protect yourself by wearing long sleeves, pants, a wide-brimmed hat, and sunglasses year round, whenever you are  outdoors.  . Once a month, do a whole-body skin exam, using a mirror to look at the skin on your back. Tell your health care provider about new moles, moles that have irregular borders, moles that are larger than a pencil eraser, or moles that have changed in shape or color.    ++++++++++++++++++++++++++++++++++++++++++++++++++++++++++++++++++  Stay current with required vaccines (immunizations).  ? Influenza vaccine. All adults should be immunized every year.  ? Tetanus, diphtheria, and acellular pertussis (Td, Tdap) vaccine. An adult who has not previously received Tdap or who does not know his vaccine status should receive 1 dose of Tdap. This initial dose should be followed by tetanus and diphtheria toxoids (Td) booster doses every 10 years. Adults with an unknown or incomplete history of completing a 3-dose immunization series with Td-containing vaccines should begin or complete a primary immunization series including a Tdap dose. Adults should receive a Td booster every  10 years.  ? Varicella vaccine. An adult without evidence of immunity to varicella should receive 2 doses or a second dose if he has previously received 1 dose.  ? Human papillomavirus (HPV) vaccine. Males aged 27-21 years who have not received the vaccine previously should receive the 3-dose series. Males aged 22-26 years may be immunized. Immunization is recommended through the age of 12 years for any male who has sex with males and did not get any or all doses earlier. Immunization is recommended for any person with an immunocompromised condition through the age of 75 years if he did not get any or all doses earlier. During the 3-dose series, the second dose should be obtained 4-8 weeks after the first dose. The third dose should be obtained 24 weeks after the first dose and 16 weeks after the second dose.  ? Zoster vaccine. One dose is recommended for adults aged 36 years or older unless certain conditions are  present.   ? PREVNAR - Pneumococcal 13-valent conjugate (PCV13) vaccine. When indicated, a person who is uncertain of his immunization history and has no record of immunization should receive the PCV13 vaccine. An adult aged 59 years or older who has certain medical conditions and has not been previously immunized should receive 1 dose of PCV13 vaccine. This PCV13 should be followed with a dose of pneumococcal polysaccharide (PPSV23) vaccine. The PPSV23 vaccine dose should be obtained at least 8 weeks after the dose of PCV13 vaccine. An adult aged 5 years or older who has certain medical conditions and previously received 1 or more doses of PPSV23 vaccine should receive 1 dose of PCV13. The PCV13 vaccine dose should be obtained 1 or more years after the last PPSV23 vaccine dose.   ? PNEUMOVAX - Pneumococcal polysaccharide (PPSV23) vaccine. When PCV13 is also indicated, PCV13 should be obtained first. All adults aged 47 years and older should be immunized. An adult younger than age 51 years who has certain medical conditions should be immunized. Any person who resides in a nursing home or long-term care facility should be immunized. An adult smoker should be immunized. People with an immunocompromised condition and certain other conditions should receive both PCV13 and PPSV23 vaccines. People with human immunodeficiency virus (HIV) infection should be immunized as soon as possible after diagnosis. Immunization during chemotherapy or radiation therapy should be avoided. Routine use of PPSV23 vaccine is not recommended for American Indians, [place] Natives, or people younger than 65 years unless there are medical conditions that require PPSV23 vaccine. When indicated, people who have unknown immunization and have no record of immunization should receive PPSV23 vaccine. One-time revaccination 5 years after the first dose of PPSV23 is recommended for people aged 19-64 years who have chronic kidney failure,  nephrotic syndrome, asplenia, or immunocompromised conditions. People who received 1-2 doses of PPSV23 before age 59 years should receive another dose of PPSV23 vaccine at age 64 years or later if at least 5 years have passed since the previous dose. Doses of PPSV23 are not needed for people immunized with PPSV23 at or after age 18 years.   ? Hepatitis A vaccine. Adults who wish to be protected from this disease, have certain high-risk conditions, work with hepatitis A-infected animals, work in hepatitis A research labs, or travel to or work in countries with a high rate of hepatitis A should be immunized. Adults who were previously unvaccinated and who anticipate close contact with an international adoptee during the first 60 days after arrival in the  [place] from a country with a high rate of hepatitis A should be immunized.  ? Hepatitis B vaccine. Adults should be immunized if they wish to be protected from this disease, have certain high-risk conditions, may be exposed to blood or other infectious body fluids, are household contacts or sex partners of hepatitis B positive people, are clients or workers in certain care facilities, or travel to or work in countries with a high rate of hepatitis B.     Preventive Service / Frequency  . Ages 8 to 60  Blood pressure check.  Lipid and cholesterol check  Lung cancer screening. / Every year if you are aged 43-80 years and have a 30-pack-year history of smoking and currently smoke or have quit within the past 15 years. Yearly screening is stopped once you have quit smoking for at least 15 years or develop a health problem that would prevent you from having lung cancer treatment.  Fecal occult blood test (FOBT) of stool. / Every year beginning at age 64 and continuing until age 68. You may not have to do this test if you get a colonoscopy every 10 years.  Flexible sigmoidoscopy** or colonoscopy.** / Every 5 years for a flexible  sigmoidoscopy or every 10 years for a colonoscopy beginning at age 60 and continuing until age 49. Screening for abdominal aortic aneurysm (AAA) by ultrasound is recommended for people who have history of high blood pressure or who are current or former smokers.  ++++++++++++++++++++++++++++++++++++++++++++++++++++++++++++++  Recommend Adult Low Dose Aspirin or coated Aspirin 81 mg daily To reduce risk of Colon Cancer 20 % Skin Cancer 26 %  Melanoma 46% and Pancreatic cancer 60%  +++++++++++++++++++++++++++++++++++++++++++++++++++++++++++++  Vitamin D goal is between 50-100. Please make sure that you are taking your Vitamin D as directed.  It is very important as a natural anti-inflammatory - helping with muscle and joint aches; as well as helping hair, skin, and nails; as well as reducing stroke, heart attack and cancer risk. It helps your bones and helps with mood. It also decreases numerous cancer risks so please take it as directed.  - Low Vit D is associated with a 200-300% higher risk for CANCER and 200-300% higher risk for HEART ATTACK & STROKE.  It is also associated with higher death rate at younger ages, autoimmune diseases like Rheumatoid arthritis, Lupus, Multiple Sclerosis; also many other serious conditions, like depression, Alzheimer's Dementia, infertility, muscle aches, fatigue, fibromyalgia - just to name a few.  +++++++++++++++++++++++++++++++++++++++++++++++++++++++++++  Recommend the book "The END of DIETING" by Dr Excell Seltzer & the book "The END of DIABETES " by Dr Excell Seltzer At [email or web address] - get book & Audio CD's   --->Being diabetic has a 300% increased risk for heart attack, stroke, cancer, and alzheimer- type vascular dementia. It is very important that you work harder with diet by avoiding all foods that are white. Avoid white rice (brown & wild rice is OK), white potatoes (sweet potatoes in moderation is OK), White bread or wheat bread or anything made out  of white flour like bagels, donuts, rolls, buns, biscuits, cakes, pastries, cookies, pizza crust, and pasta (made from white flour & egg whites) - vegetarian pasta or spinach or wheat pasta is OK. Multigrain breads like Arnold's or [employer], or multigrain sandwich thins or flatbreads. Diet, exercise and weight loss can reverse and cure diabetes in the early stages. Diet, exercise and weight loss is very important in the control and prevention of complications of diabetes which  affects every system in your body, ie. Brain - dementia/stroke, eyes - glaucoma/blindness, heart - heart attack/heart failure, kidneys - dialysis, stomach - gastric paralysis, intestines - malabsorption, nerves - severe painful neuritis, circulation - gangrene & loss of a leg(s), and finally cancer and Alzheimers.  I recommend avoid fried & greasy foods, sweets/candy, white rice (brown or wild rice or Quinoa is OK), white potatoes (sweet potatoes are OK) - anything made from white flour - bagels, doughnuts, rolls, buns, biscuits,white and wheat breads, pizza crust and traditional pasta made of white flour & egg white(vegetarian pasta or spinach or wheat pasta is OK). Multi-grain bread is OK - like multi-grain flat bread or sandwich thins. Avoid alcohol in excess.  Exercise is also important. Eat all the vegetables you want - avoid fatty meats, especially red meat and dairy - especially cheese. Cheese is the most concentrated form of trans-fats which is the worst thing to clog up our arteries. Veggie cheese is OK which can be found in the fresh produce section at [employer] or [employer] or [employer].  ++++++++++++++++++++++ DASH Eating Plan  DASH stands for "Dietary Approaches to Stop Hypertension."  The DASH eating plan is a healthy eating plan that has been shown to reduce high blood pressure (hypertension). Additional health benefits may include reducing the risk of type 2 diabetes mellitus, heart disease, and stroke.  The DASH eating plan may also help with weight loss.  WHAT DO I NEED TO KNOW ABOUT THE DASH EATING PLAN? For the DASH eating plan, you will follow these general guidelines: . Choose foods with a percent daily value for sodium of less than 5% (as listed on the food label). . Use salt-free seasonings or herbs instead of table salt or sea salt. . Check with your health care provider or pharmacist before using salt substitutes. . Eat lower-sodium products, often labeled as "lower sodium" or "no salt added." . Eat fresh foods. . Eat more vegetables, fruits, and low-fat dairy products. . Choose whole grains. Look for the word "whole" as the first word in the ingredient list. . Choose fish . Limit sweets, desserts, sugars, and sugary drinks. . Choose heart-healthy fats. . Eat veggie cheese . Eat more home-cooked food and less restaurant, buffet, and fast food. . Limit fried foods. Lacinda Axon foods using methods other than frying. . Limit canned vegetables. If you do use them, rinse them well to decrease the sodium. . When eating at a restaurant, ask that your food be prepared with less salt, or no salt if possible.   WHAT FOODS CAN I EAT? Read Dr Fara Olden Fuhrman's books on The End of Dieting & The End of Diabetes  Grains Whole grain or whole wheat bread. Brown rice. Whole grain or whole wheat pasta. Quinoa, bulgur, and whole grain cereals. Low-sodium cereals. Corn or whole wheat flour tortillas. Whole grain cornbread. Whole grain crackers. Low-sodium crackers.  Vegetables Fresh or frozen vegetables (raw, steamed, roasted, or grilled). Low-sodium or reduced-sodium tomato and vegetable juices. Low-sodium or reduced-sodium tomato sauce and paste. Low-sodium or reduced-sodium canned vegetables.  Fruits All fresh, canned (in natural juice), or frozen fruits.  Protein Products All fish and seafood. Dried beans, peas, or lentils. Unsalted nuts and seeds. Unsalted canned  beans.  Dairy Low-fat dairy products, such as skim or 1% milk, 2% or reduced-fat cheeses, low-fat ricotta or cottage cheese, or plain low-fat yogurt. Low-sodium or reduced-sodium cheeses.  Fats and Oils Tub margarines without trans fats. Light or reduced-fat mayonnaise and salad  dressings (reduced sodium). Avocado. Safflower, olive, or canola oils. Natural peanut or almond butter.  Other Unsalted popcorn and pretzels. The items listed above may not be a complete list of recommended foods or beverages. Contact your dietitian for more options.  ++++++++++++++++++++++++++++++++++++++++++++++++++++++++++++++++  WHAT FOODS ARE NOT RECOMMENDED?  Grains/ White flour or wheat flour White bread. White pasta. White rice. Refined cornbread. Bagels and croissants. Crackers that contain trans fat. Vegetables Creamed or fried vegetables. Vegetables in a . Regular canned vegetables. Regular canned tomato sauce and paste. Regular tomato and vegetable juices. Fruits Dried fruits. Canned fruit in light or heavy syrup. Fruit juice. Meat and Other Protein Products Meat in general - RED meat & White meat. Fatty cuts of meat. Ribs, chicken wings, all processed meats as bacon, sausage, bologna, salami, fatback, hot dogs, bratwurst and packaged luncheon meats. Dairy Whole or 2% milk, cream, half-and-half, and cream cheese. Whole-fat or sweetened yogurt. Full-fat cheeses or blue cheese. Non-dairy creamers and whipped toppings. Processed cheese, cheese spreads, or cheese curds.  Condiments Onion and garlic salt, seasoned salt, table salt, and sea salt. Canned and packaged gravies. Worcestershire sauce. Tartar sauce. Barbecue sauce. Teriyaki sauce. Soy sauce, including reduced sodium. Steak sauce. Fish sauce. Oyster sauce. Cocktail sauce. Horseradish. Ketchup and mustard. Meat flavorings and tenderizers. Bouillon cubes. Hot sauce. Tabasco sauce. Marinades. Taco seasonings. Relishes. Fats and Oils Butter, stick  margarine, lard, shortening and bacon fat. Coconut, palm kernel, or palm oils. Regular salad dressings. Pickles and olives. Salted popcorn and pretzels. The items listed above may not be a complete list of foods and beverages to avoid.

## 2016-12-31 NOTE — Progress Notes (Signed)
Male physical  Impression and Recommendations:    1. Yearly Health maintenance examination   2. Glucose intolerance (impaired glucose tolerance)   3. CKD (chronic kidney disease), stage III     -->  reminded pt to come in Oct for flu immunizations if desired  1) Anticipatory Guidance: Discussed importance of wearing a seatbelt while driving, not texting while driving;   sunscreen when outside along with skin surveillance; eating a balanced and modest diet; physical activity at least 25 minutes per day or 150 min/ week moderate to intense activity.  2) Immunizations / Screenings / Labs:  All immunizations are up-to-date per recommendations or will be updated today. Patient is due for dental and vision screens which pt will schedule independently. Will obtain CBC, CMP, HgA1c, Lipid panel, TSH and vit D when fasting, if not already done recently.   3) Weight:  BMI meaning discussed with patient.  Discussed goal of keeping BMI less than 25 which would improve overall feelings of well being and improve objective health data. Improve nutrient density of diet through increasing intake of fruits and vegetables and decreasing saturated fats, white flour products and refined sugars.   Follow-up preventative CPE in 1 year.    Return in about 6 months (around 06/30/2017) for OV with me- will also recheck labs-A1c, BMP, including GFR.  ==F/up for chronic care management and/or prn    Subjective:    CC: CPE  HPI: Jesus [name] is a 71 y.o. male who presents to [place] [facility] today for a yearly health maintenance exam.    Health Maintenance Summary Reviewed and updated, unless pt declines services.  Colonoscopy:    Last performed in February 2017.  Per the gastroenterologist patient does not need any further colonoscopies.   No polyps.   Pt is certain of this.  Tdap: Up to date Pneumovax/PPSV23:  Up to date: see Immunizations. Prevnar 13/PCV13:  Up to date: see  Immunizations. Zostavax:   See immmunizations- UTD Tobacco History Reviewed:   never CT scan for screening lung CA:   n/a Abdominal Ultrasound:    n/a Alcohol:    No concerns, no excessive use Exercise Habits:   good STD concerns:   none Drug Use:   None Birth control method:   n/a Testicular/penile concerns:     None  Skin exam--> Dr Allyson Sabal- yrly or every 2 yrs.   Every 6 month urologic examinations with his Urologist    Health Maintenance  Topic Date Due  . Hepatitis C Screening  11/26/2028 (Originally Jul 06, 1945)  . TETANUS/TDAP  08/18/2018  . COLONOSCOPY  06/05/2025  . INFLUENZA VACCINE  Completed  . PNA vac Low Risk Adult  Discontinued      Wt Readings from Last 3 Encounters:  03/26/17 153 lb (69.4 kg)  12/31/16 166 lb 4.8 oz (75.4 kg)  12/12/16 165 lb 14.4 oz (75.3 kg)   BP Readings from Last 3 Encounters:  03/26/17 120/72  12/31/16 120/75  12/12/16 117/72   Pulse Readings from Last 3 Encounters:  12/31/16 70  12/12/16 63  11/26/16 71    Patient Active Problem List   Diagnosis Date Noted  . Glucose intolerance (impaired glucose tolerance) 12/12/2016    Priority: High  . CKD (chronic kidney disease), stage III ([name]) 12/12/2016    Priority: High  . Vitamin D deficiency 06/26/2017  . Plantar fasciitis of left foot 03/26/2017  . 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 Elevated PSA 11/28/2016  .  Generalized OA 11/26/2016  . BPH (benign prostatic hyperplasia) 11/26/2016  . Environmental and seasonal allergies 11/26/2016  . Inguinal hernia- b/l and abd hernia repaired with mesh 11/26/2016  . S/P colonoscopy 11/26/2016  . h/o Renal stones- ca oxalate.  11/26/2016  . Cervicalgia 11/26/2016  . Peripheral neuropathy 11/26/2016    Past Medical History:  Diagnosis Date  . Arthritis   . BPH (benign prostatic hyperplasia)   . ED (erectile dysfunction)   . Elevated PSA   . History of acute renal failure    08/ 2017  . History of adenomatous polyp of colon      2005 TUBULAR ADENOMA  . History of herpes genitalis   . Hypogonadism, testicular   . Lower urinary tract symptoms (LUTS)   . Nephrolithiasis    multiple right side non-obstructive per ct 11-27-2015  . Peyronie's disease   . Right ureteral stone     Past Surgical History:  Procedure Laterality Date  . ABDOMINAL HERNIA REPAIR  02/2014  . COLONOSCOPY WITH PROPOFOL N/A 06/06/2015   Procedure: COLONOSCOPY WITH PROPOFOL;  Surgeon: [name], MD;  Location: [facility];  Service: Endoscopy;  Laterality: N/A;  . CYSTOSCOPY WITH RETROGRADE PYELOGRAM, URETEROSCOPY AND STENT PLACEMENT Right 11/30/2015   Procedure: CYSTOSCOPY/RETROGRADE/URETEROSCOPY/;  Surgeon: [name], MD;  Location: [facility];  Service: Urology;  Laterality: Right;  . CYSTOSCOPY/URETEROSCOPY/HOLMIUM LASER/STENT PLACEMENT Right 12/20/2015   Procedure: CYSTOSCOPY/URETEROSCOPY/HOLMIUM LASER/ REMOVE RIGHT JJ STENT;  Surgeon: [name], MD;  Location: [facility];  Service: Urology;  Laterality: Right;  1 HOUR  . INGUINAL HERNIA REPAIR Left 2001  . KNEE ARTHROSCOPY Left 2005  . NESBIT PLICATION FOR PEYRONIE'S DISEASE  05/01/2013   and Right Inguinal Hernia Repair  . TONSILLECTOMY  child    Family History  Problem Relation Age of Onset  . Alcohol abuse Mother   . Cancer Father        Brain  . Cancer Sister        breast  . Hypertension Paternal Grandmother   . Depression Paternal Grandmother        suicide    Social History   Substance and Sexual Activity  Drug Use No  ,  Social History   Substance and Sexual Activity  Alcohol Use Yes  . Alcohol/week: 2.4 oz  . Types: 4 Cans of beer per week  ,  Social History   Tobacco Use  Smoking Status Never Smoker  Smokeless Tobacco Never Used  ,  Social History   Substance and Sexual Activity  Sexual Activity Yes  . Birth control/protection: None    Patient's Medications  New Prescriptions   No medications on  file  Previous Medications   CHOLECALCIFEROL (VITAMIN D-3) 5000 UNITS TABS    Take 1 tablet by mouth every morning.   FERROUS SULFATE 325 (65 FE) MG TABLET    Take 325 mg by mouth daily with breakfast.   FEXOFENADINE (ALLEGRA) 180 MG TABLET    Take 180 mg by mouth 3 (three) times a week. Jan- March.    MELOXICAM (MOBIC) 15 MG TABLET    Take 15 mg by mouth daily as needed for pain.   MENAQUINONE-7 (VITAMIN K2) 100 MCG CAPS    Take by mouth.   MULTIPLE VITAMIN (MULTIVITAMIN WITH MINERALS) TABS TABLET    Take 1 tablet by mouth daily.   OLIVE LEAF EXTRACT 150 MG CAPS    Take by mouth.   OMEGA-3 FATTY ACIDS (OMEGA 3 PO)  Take 1 tablet by mouth every morning.   PYRIDOXINE (VITAMIN B-6) 100 MG TABLET    Take 100 mg by mouth daily.   TAMSULOSIN (FLOMAX) 0.4 MG CAPS CAPSULE    Take 0.4 mg by mouth every morning.   TESTOSTERONE 10 MG/ACT (2%) GEL    Apply 1 application topically daily.   VITAMIN A 49826 UNIT CAPSULE    Take 10,000 Units by mouth daily.  Modified Medications   No medications on file  Discontinued Medications   No medications on file    Patient has no known allergies.  Review of Systems: General:   Denies fever, chills, unexplained weight loss.  Optho/Auditory:   Denies visual changes, blurred vision/LOV Respiratory:   Denies SOB, DOE more than baseline levels.  Cardiovascular:   Denies chest pain, palpitations, new onset peripheral edema  Gastrointestinal:   Denies nausea, vomiting, diarrhea.  Genitourinary: Denies dysuria, freq/ urgency, flank pain or discharge from genitals.  Endocrine:     Denies hot or cold intolerance, polyuria, polydipsia. Musculoskeletal:   Denies unexplained myalgias, joint swelling, unexplained arthralgias, gait problems.  Skin:  Denies rash, suspicious lesions Neurological:     Denies dizziness, unexplained weakness, numbness  Psychiatric/Behavioral:   Denies mood changes, suicidal or homicidal ideations, hallucinations    Objective:      Blood pressure 120/75, pulse 70, height 5' 9.25" (1.759 m), weight 166 lb 4.8 oz (75.4 kg). Body mass index is 24.38 kg/m. General Appearance:    Alert, cooperative, no distress, appears stated age  Head:    Normocephalic, without obvious abnormality, atraumatic  Eyes:    PERRL, conjunctiva/corneas clear, EOM's intact, fundi    benign, both eyes  Ears:    Normal TM's and external ear canals, both ears  Nose:   Nares normal, septum midline, mucosa normal, no drainage    or sinus tenderness  Throat:   Lips w/o lesion, mucosa moist, and tongue normal; teeth and   gums normal  Neck:   Supple, symmetrical, trachea midline, no adenopathy;    thyroid:  no enlargement/tenderness/nodules; no carotid   bruit or JVD  Back:     Symmetric, no curvature, ROM normal, no CVA tenderness  Lungs:     Clear to auscultation bilaterally, respirations unlabored, no       Wh/ R/ R  Chest Wall:    No tenderness or gross deformity; normal excursion   Heart:    Regular rate and rhythm, S1 and S2 normal, no murmur, rub   or gallop  Abdomen:     Soft, non-tender, bowel sounds active all four quadrants, NO   G/R/R, no masses, no organomegaly  Genitalia:   deferred by pt  Rectal:    Deferred by pt  Extremities:   Extremities normal, atraumatic, no cyanosis or gross edema  Pulses:   2+ and symmetric all extremities  Skin:   Warm, dry, Skin color, texture, turgor normal, no obvious rashes or lesions  M-Sk:   Ambulates * 4 w/o difficulty, no gross deformities, tone WNL  Neurologic:   CNII-XII intact, normal strength, sensation and reflexes    Throughout Psych:  No HI/SI, judgement and insight good, Euthymic mood. Full Affect.

## 2017-01-01 IMAGING — CT CT RENAL STONE PROTOCOL
2 of 3 series · 16 of 46 positions shown, 18 images · non-contrast
Comparison: 01/31/2009

CLINICAL DATA: Right-sided flank pain for several hours

EXAM:
CT ABDOMEN AND PELVIS WITHOUT CONTRAST
TECHNIQUE: Multidetector CT imaging of the abdomen and pelvis was performed
following the standard protocol without IV contrast.

[Series 2: renal stone 5mm · axial · 0.68mm/px · z∈[-1144,-754]mm · 13 of 90 slices shown, 15 images]
[im 6/90  soft-tissue]
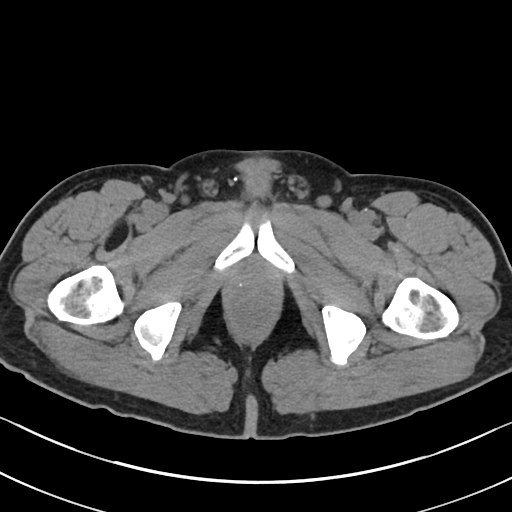
[im 6/90  bone]
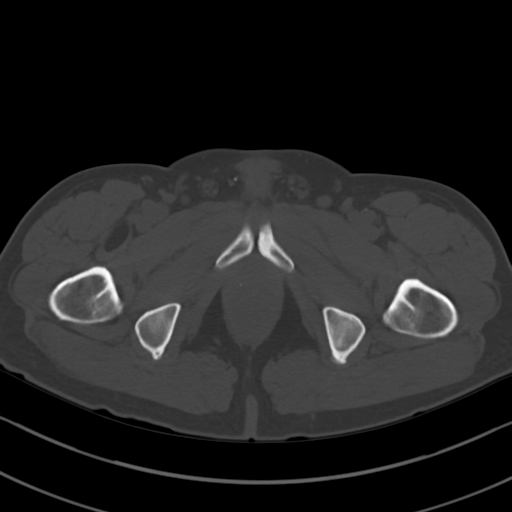
[im 12/90  soft-tissue]
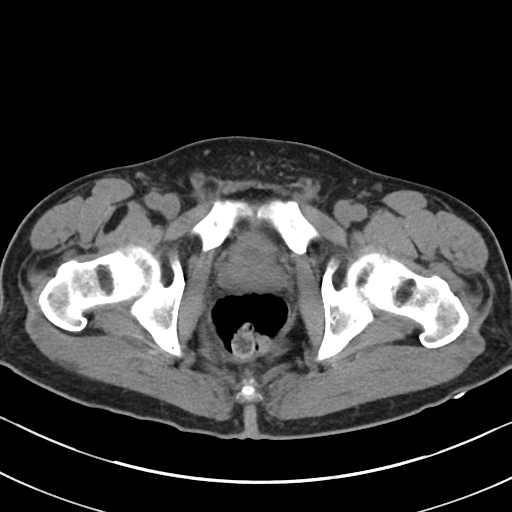
[im 18/90  soft-tissue]
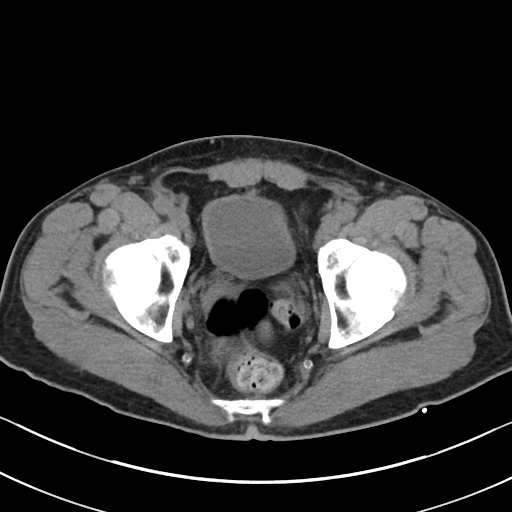
[im 26/90  soft-tissue]
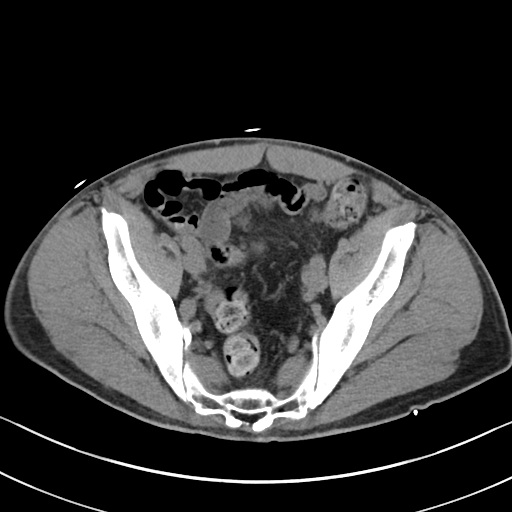
[im 32/90  soft-tissue]
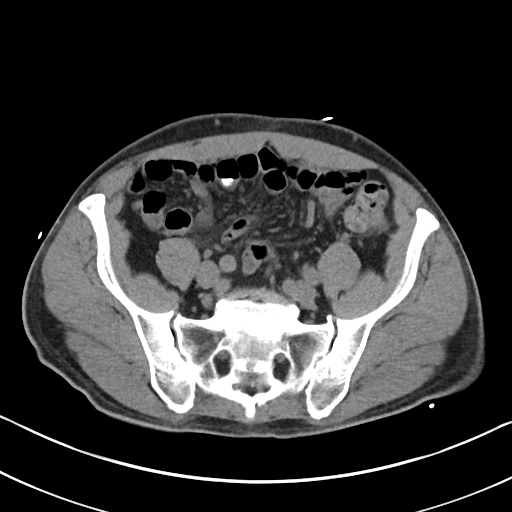
[im 38/90  soft-tissue]
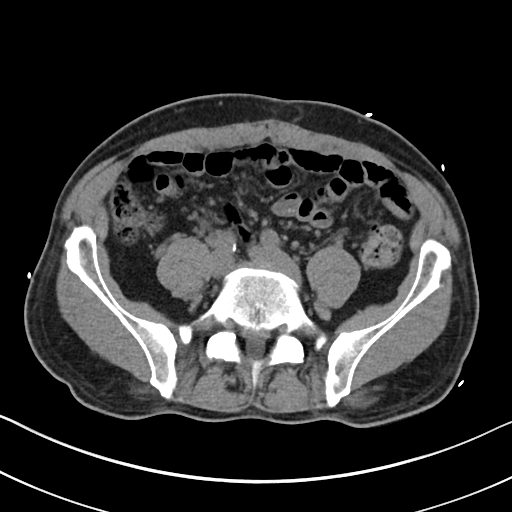
[im 46/90  soft-tissue]
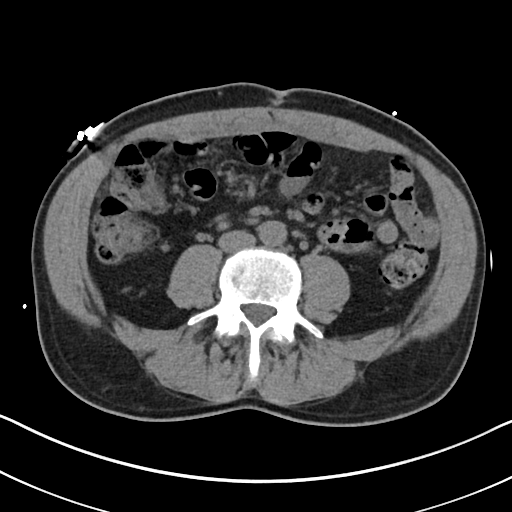
[im 52/90  soft-tissue]
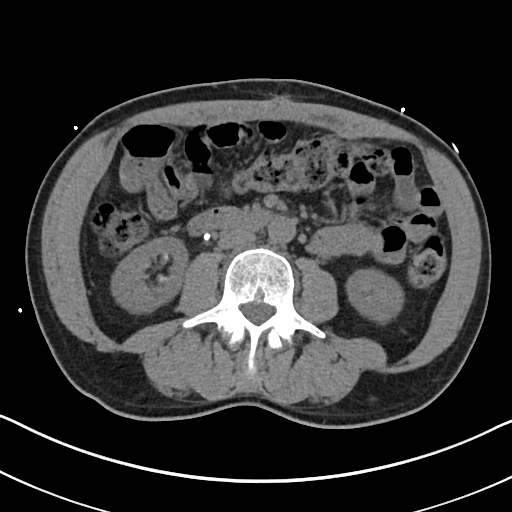
[im 58/90  soft-tissue]
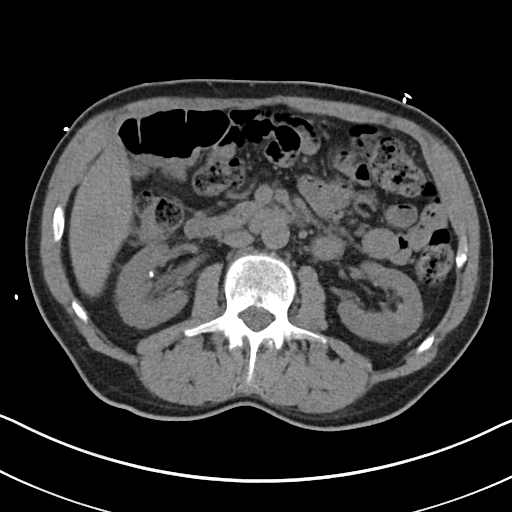
[im 58/90  bone]
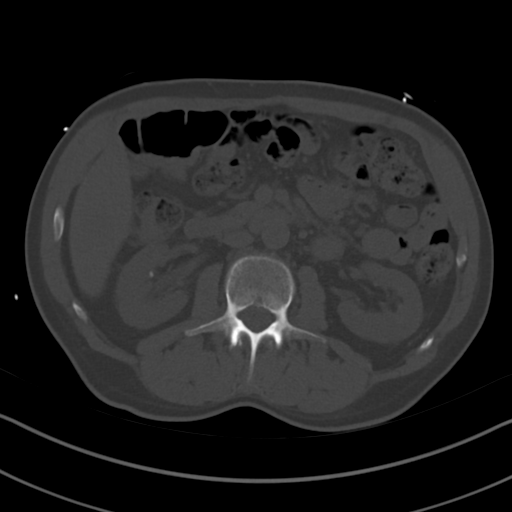
[im 64/90  soft-tissue]
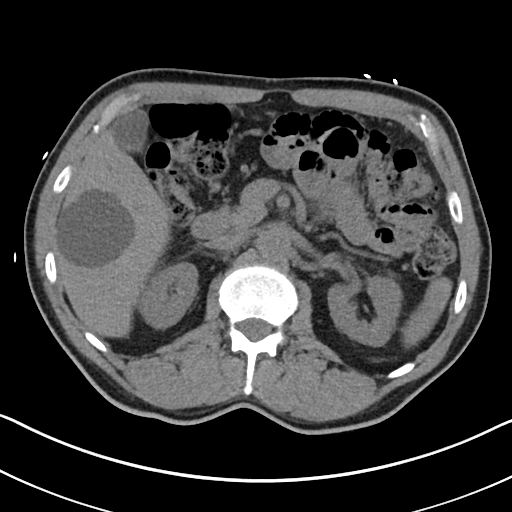
[im 72/90  soft-tissue]
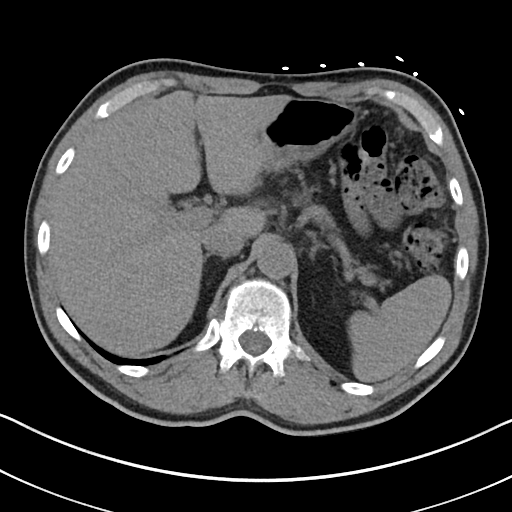
[im 78/90  soft-tissue]
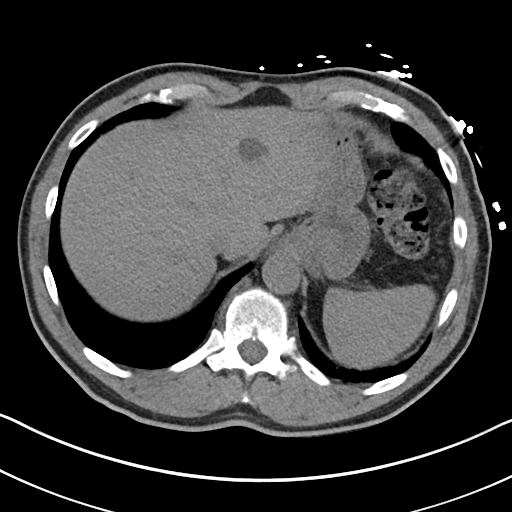
[im 84/90  soft-tissue]
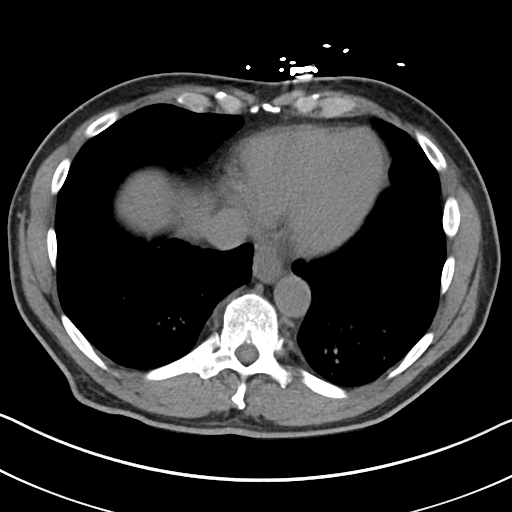

[Series 4: renal stone 3.0 cor · coronal · 0.75mm/px · 3 of 77 slices shown]
[im 26/77  soft-tissue]
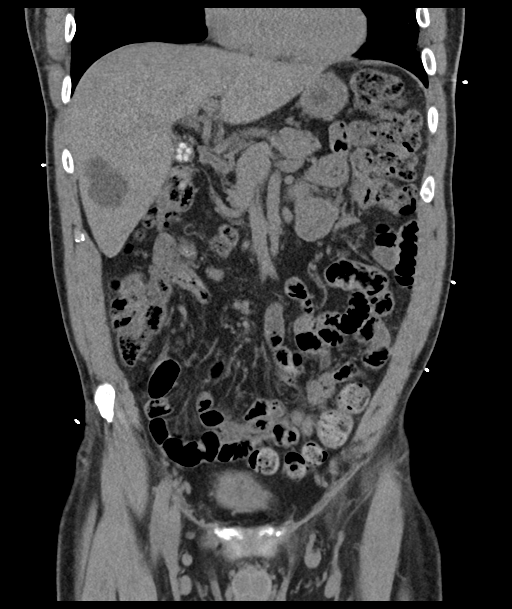
[im 34/77  soft-tissue]
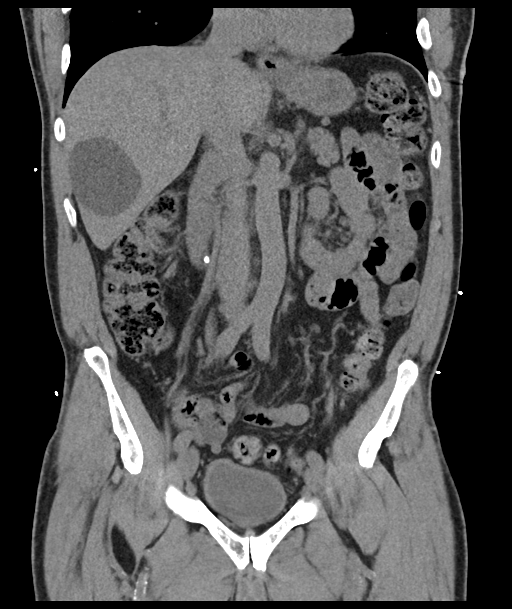
[im 43/77  soft-tissue]
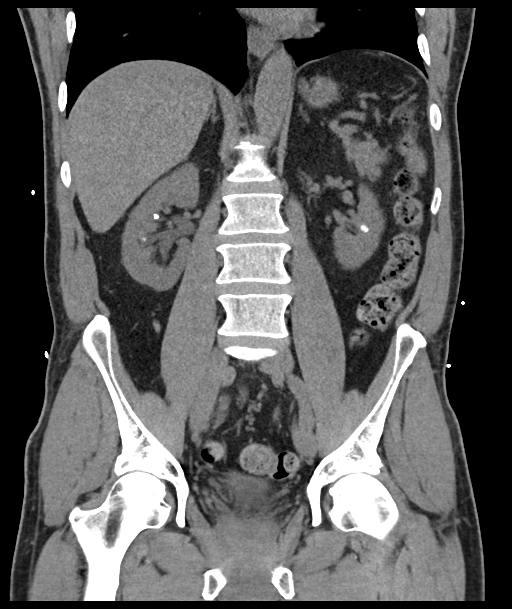

[16 of 46 positions shown; findings below may reference images not displayed]

FINDINGS: Lower chest: Mild atelectatic changes are noted in the left lower
lobe posteriorly.

Hepatobiliary: Gallbladder is well distended with multiple
gallstones. No gallbladder wall thickening or pericholecystic fluid
is noted. Cysts are again noted within the liver but increased in
size when compared with the prior exam. The largest of these lies in
the right lobe measuring 5.3 cm.

Pancreas: No mass or inflammatory process identified on this
un-enhanced exam.

Spleen: Within normal limits in size.

Adrenals/Urinary Tract: The adrenal glands are within normal limits.
Scarring and cortical calcifications are again seen in the lower
pole on the left. A 5 mm proximal right ureteral stone is noted with
mild hydronephrosis. Scattered small less than 5 mm nonobstructing
stones are noted on the right. These are new from the prior exam.
The more distal right ureter is within normal limits. The bladder is
partially distended.

Stomach/Bowel: No evidence of obstruction, inflammatory process, or
abnormal fluid collections. The appendix is within normal limits.

Vascular/Lymphatic: No pathologically enlarged lymph nodes. No
evidence of abdominal aortic aneurysm.

Reproductive: No mass or other significant abnormality.

Other: None.

Musculoskeletal: Degenerative changes are noted within the lumbar
spine.
IMPRESSION: 5 mm obstructing stone in the proximal right ureter. Scattered small
nonobstructing right renal stones are noted.

Cholelithiasis without complicating factors.

Other chronic changes stable from the previous exam.

## 2017-02-12 DIAGNOSIS — K080 Exfoliation of teeth due to systemic causes: Secondary | ICD-10-CM | POA: Diagnosis not present

## 2017-03-18 DIAGNOSIS — M1712 Unilateral primary osteoarthritis, left knee: Secondary | ICD-10-CM | POA: Diagnosis not present

## 2017-03-18 DIAGNOSIS — M7582 Other shoulder lesions, left shoulder: Secondary | ICD-10-CM | POA: Diagnosis not present

## 2017-03-18 DIAGNOSIS — M722 Plantar fascial fibromatosis: Secondary | ICD-10-CM | POA: Diagnosis not present

## 2017-03-26 ENCOUNTER — Encounter: Payer: Self-pay | Admitting: Family Medicine

## 2017-03-26 ENCOUNTER — Ambulatory Visit: Payer: Federal, State, Local not specified - PPO | Admitting: Family Medicine

## 2017-03-26 DIAGNOSIS — M722 Plantar fascial fibromatosis: Secondary | ICD-10-CM | POA: Diagnosis not present

## 2017-03-26 NOTE — Progress Notes (Signed)
   HPI  CC: Left heel pain Patient is here with complaints of left-sided heel pain.  He was originally seen by Dr. Noemi Chapel at [facility] orth[facility].  Patient was referred here for custom orthotics today.  He states that his left foot/heel pain began a few months ago.  It is described as achy with occasional sharpness.  Pain is worse first thing in the morning but eventually seems to improve as he becomes more active throughout his day.  He denies any trauma, injury, or events that may have caused this pain.  He states that he has had a plantar fasciitis in the past and this feels similar but slightly worse.  He denies any weakness, numbness, or paresthesias.  Medications/Interventions Tried: rest and ice  See HPI and/or previous note for associated ROS.  Objective: BP 120/72   Ht 5\' 10"  (1.778 m)   Wt 153 lb (69.4 kg)   BMI 21.95 kg/m  Gen: NAD, well groomed, a/o x3, normal affect.  CV: Well-perfused. Warm.  Resp: Non-labored.  Neuro: Sensation intact throughout. No gross coordination deficits.  Gait: Nonpathologic posture, unremarkable stride without signs of limp or balance issues. Ankle/Foot, Left: TTP noted at the plantar aspect of the calcaneus, slightly medial from midline. No visible erythema, swelling, ecchymosis, or bony deformity. No notable pes planus/cavus deformity. Transverse arch grossly collapsed with bilateral bunion formation (L>R); No evidence of tibiotalar deviation; Range of motion is full in all directions. Strength is 5/5 in all directions. No tenderness at the insertion/body/myotendinous junction of the Achilles tendon; No peroneal tendon tenderness or subluxation; No tenderness on posterior aspects of lateral and medial malleolus; Stable lateral and medial ligaments; Talar dome nontender; Unremarkable calcaneal squeeze; No tenderness over the navicular prominence; No tenderness over cuboid; No pain at base of 5th MT; No tenderness at the distal metatarsals; Able to  walk 4 steps.   Custom Orthotics: - Patient was fitted for a standard, cushioned, semi-rigid orthotic. - Orthotic was heated and afterward the patient stood on the orthotic blank positioned on the orthotic stand. - The patient was positioned in subtalar neutral position and 10 degrees of ankle dorsiflexion in a weight bearing stance. - After completion of molding, a stable base was applied to the orthotic blank. - The blank was ground to a stable position for weight bearing. - Size: 9 - Base: Blue EVA - Additional Posting and Padding: None (purposely left some additional cushion at the heel bilaterally) - The patient ambulated in these, and they were very comfortable.   Assessment and plan:  Plantar fasciitis of left foot Patient is here with signs and symptoms consistent with plantar fasciitis.  Referred here today by Dr. Noemi Chapel for custom orthotics. -Orthotics created today.  Patient ambulated in them and endorsed that their comfort. -Follow-up as needed  I spent 30 minutes with this patient. Over 50% of visit was spent in counseling and coordination of care for problems with plantar fasciitis.  Elberta Leatherwood, MD,MS [facility] Fellow 03/26/2017 5:23 PM

## 2017-03-26 NOTE — Assessment & Plan Note (Signed)
Patient is here with signs and symptoms consistent with plantar fasciitis.  Referred here today by Dr. Noemi Chapel for custom orthotics. -Orthotics created today.  Patient ambulated in them and endorsed that their comfort. -Follow-up as needed

## 2017-06-17 DIAGNOSIS — N401 Enlarged prostate with lower urinary tract symptoms: Secondary | ICD-10-CM | POA: Diagnosis not present

## 2017-06-17 DIAGNOSIS — E291 Testicular hypofunction: Secondary | ICD-10-CM | POA: Diagnosis not present

## 2017-06-17 LAB — PSA: PSA: 2.75

## 2017-06-19 ENCOUNTER — Other Ambulatory Visit (INDEPENDENT_AMBULATORY_CARE_PROVIDER_SITE_OTHER): Payer: Federal, State, Local not specified - PPO

## 2017-06-19 DIAGNOSIS — N400 Enlarged prostate without lower urinary tract symptoms: Secondary | ICD-10-CM

## 2017-06-19 DIAGNOSIS — N183 Chronic kidney disease, stage 3 unspecified: Secondary | ICD-10-CM

## 2017-06-19 DIAGNOSIS — R7302 Impaired glucose tolerance (oral): Secondary | ICD-10-CM

## 2017-06-19 NOTE — Addendum Note (Signed)
Addended by: Lanier Prude D on: 06/19/2017 08:33 AM   Modules accepted: Orders

## 2017-06-20 LAB — BASIC METABOLIC PANEL
BUN/Creatinine Ratio: 14 (ref 10–24)
BUN: 18 mg/dL (ref 8–27)
CHLORIDE: 103 mmol/L (ref 96–106)
CO2: 29 mmol/L (ref 20–29)
Calcium: 9.6 mg/dL (ref 8.6–10.2)
Creatinine, Ser: 1.29 mg/dL — ABNORMAL HIGH (ref 0.76–1.27)
GFR calc Af Amer: 63 mL/min/{1.73_m2} (ref >59)
GFR, EST NON AFRICAN AMERICAN: 55 mL/min/{1.73_m2} — AB (ref >59)
Glucose: 113 mg/dL — ABNORMAL HIGH (ref 65–99)
POTASSIUM: 4.8 mmol/L (ref 3.5–5.2)
Sodium: 146 mmol/L — ABNORMAL HIGH (ref 134–144)

## 2017-06-20 LAB — HEMOGLOBIN A1C
ESTIMATED AVERAGE GLUCOSE: 126 mg/dL
Hgb A1c MFr Bld: 6 % — ABNORMAL HIGH (ref 4.8–5.6)

## 2017-06-26 ENCOUNTER — Ambulatory Visit: Payer: Federal, State, Local not specified - PPO | Admitting: Family Medicine

## 2017-06-26 ENCOUNTER — Encounter: Payer: Self-pay | Admitting: Family Medicine

## 2017-06-26 VITALS — BP 106/60 | HR 90 | Temp 99.9°F | Ht 69.25 in | Wt 163.6 lb

## 2017-06-26 DIAGNOSIS — R05 Cough: Secondary | ICD-10-CM | POA: Diagnosis not present

## 2017-06-26 DIAGNOSIS — E559 Vitamin D deficiency, unspecified: Secondary | ICD-10-CM | POA: Diagnosis not present

## 2017-06-26 DIAGNOSIS — J3089 Other allergic rhinitis: Secondary | ICD-10-CM | POA: Diagnosis not present

## 2017-06-26 DIAGNOSIS — R7302 Impaired glucose tolerance (oral): Secondary | ICD-10-CM | POA: Diagnosis not present

## 2017-06-26 DIAGNOSIS — N401 Enlarged prostate with lower urinary tract symptoms: Secondary | ICD-10-CM | POA: Diagnosis not present

## 2017-06-26 DIAGNOSIS — N183 Chronic kidney disease, stage 3 unspecified: Secondary | ICD-10-CM

## 2017-06-26 DIAGNOSIS — J111 Influenza due to unidentified influenza virus with other respiratory manifestations: Secondary | ICD-10-CM | POA: Diagnosis not present

## 2017-06-26 DIAGNOSIS — R058 Other specified cough: Secondary | ICD-10-CM

## 2017-06-26 DIAGNOSIS — R509 Fever, unspecified: Secondary | ICD-10-CM

## 2017-06-26 DIAGNOSIS — K4090 Unilateral inguinal hernia, without obstruction or gangrene, not specified as recurrent: Secondary | ICD-10-CM

## 2017-06-26 DIAGNOSIS — R3915 Urgency of urination: Secondary | ICD-10-CM | POA: Diagnosis not present

## 2017-06-26 DIAGNOSIS — E291 Testicular hypofunction: Secondary | ICD-10-CM | POA: Diagnosis not present

## 2017-06-26 DIAGNOSIS — Z711 Person with feared health complaint in whom no diagnosis is made: Secondary | ICD-10-CM

## 2017-06-26 MED ORDER — HYDROCOD POLST-CPM POLST ER 10-8 MG/5ML PO SUER: 5.0000 mL | Freq: Two times a day (BID) | ORAL | 0 refills | Status: DC | PRN

## 2017-06-26 MED ORDER — OSELTAMIVIR PHOSPHATE 30 MG PO CAPS: 30.0000 mg | ORAL_CAPSULE | Freq: Two times a day (BID) | ORAL | 0 refills | Status: DC

## 2017-06-26 NOTE — Progress Notes (Signed)
Impression and Recommendations:    1. CKD (chronic kidney disease), stage III ([place])   2. Glucose intolerance (impaired glucose tolerance)   3. Vitamin D deficiency   4. Environmental and seasonal allergies   5. Influenza   6. Fever and chills   7. Non-productive cough   8. No problem, feared complaint unfounded- no inguinial hernia   9. Inguinal hernia without obstruction or gangrene, recurrence not specified, unspecified laterality       1. CKD stage 3-  -Serum creatinine was 1.4 on 11-28-16, this is down from recent 06-19-17 where it is 1.29, which is still elevated.  -drink adequate amounts of water, equal to half of your weight in oz per day. -prefer to eat oranges as opposed to drinking orange juice. Try to drink low calorie beverages.   2. Glucose intolerance -A1c 06-19-17 6.0, stable and unchanged from prior on 11-28-16 where A1c was also 6.0.  3. Vitamin D deficiency- continue supplements.  4. Environmental and seasonal allergies- continue OTC allergy medications  5. Influenza -start tamiflu. Told pt to wash his hands often and try to prevent exposure to others. -since creatinine clearance is only 53, we will be giving lower dose BID of tamiflu.   6. Fever and chills -Take OTC tylenol for fevers and body chills. Do not take aleve or advil due to your chronic kidney disease. -do not take advil and aleve. Drink adequate amounts of water per day, equal to half of your weight in oz. Recommend daily exercise and control blood sugar, BP. -Push fluids, get plenty of rest.  -Explained to pt he is most contagious when he has fevers, but he is also contagious when coughing, sneezing, etc.   7. Non-productive cough -start tussionex  8. No problem, feared complaint unfounded- 9. Inguinal hernia without osbtruction or gangrene, recurrence, not specific. --pt has a PSHx of 3 hernias, (two inguinal and one abdominal). No evidence of hernia today. Reassured pt. -If you  are exercising and sweating, drink an additional bottle of water every half hour.   Retinopathy -per pt stable and unchanged at this time. Explained could be due to prediabetes or lumbar disc disease, but pt declined further evaluation at this time. Will continue to monitor closely.   No orders of the defined types were placed in this encounter.   Meds ordered this encounter  Medications  . oseltamivir (TAMIFLU) 30 MG capsule    Sig: Take 1 capsule (30 mg total) by mouth 2 (two) times daily.    Dispense:  10 capsule    Refill:  0  . chlorpheniramine-HYDROcodone (TUSSIONEX) 10-8 MG/5ML SUER    Sig: Take 5 mLs by mouth every 12 (twelve) hours as needed for cough (cough, will cause drowsiness.).    Dispense:  120 mL    Refill:  0    Gross side effects, risk and benefits, and alternatives of medications and treatment plan in general discussed with patient.  Patient is aware that all medications have potential side effects and we are unable to predict every side effect or drug-drug interaction that may occur.   Patient will call with any questions prior to using medication if they have concerns.  Expresses verbal understanding and consents to current therapy and treatment regimen.  No barriers to understanding were identified.  Red flag symptoms and signs discussed in detail.  Patient expressed understanding regarding what to do in case of emergency\urgent symptoms  Please see AVS handed out to patient at the  end of our visit for further patient instructions/ counseling done pertaining to today's office visit.   Return for pre-DM, CKD follow up in 4-6 mo (BMP w GFR, A1c).    Note: This note was prepared with assistance of Dragon voice recognition software. Occasional wrong-word or sound-a-like substitutions may have occurred due to the inherent limitations of voice recognition software.   Pt was in the office today for 36+ minutes, with over 50% time spent in face to face counseling of  patients various medical conditions, treatment plans of those medical conditions including medicine management and lifestyle modification, strategies to improve health and well being; and in coordination of care. SEE ABOVE FOR DETAILS  This document serves as a record of services personally performed by [name], DO. It was created on her behalf by Mayer Masker, a trained medical scribe. The creation of this record is based on the scribe's personal observations and the provider's statements to them.   I have reviewed the above medical documentation for accuracy and completeness and I concur.  [name] 06/26/17 5:19 PM   --------------------------------------------------------------------------------------------------------------------------------------------------------------------------------------------------------------    Subjective:     HPI: Jesus [name] is a 72 y.o. male who presents to [facility] today for issues as discussed below.  Cough Pt complains of chronic productive (clear) coughing, sneezing, fever, wheezing, SOB, and chills. He states a few weeks ago, he had a cold that lasted for 3 days- he took medications and was fine afterwards. He still has a lingering cough since then.  Yesterday he was playing golf, went home and noticed he was tired, so he took a nap and woke up, then had rapid onset fever. He does not feel like he was ran over by a truck, but he does have mild generalized myalgias. Fever was 101 yesterday, 102 last night, 101 this morning- he has not taken any tylenol or antipyretics. He does not think it is the flu. He states he had a friend over for dinner who did not have any symptoms, but that friend's wife was positive with the flu and later his friend then developed the flu. He denies HA.    Prediabetes-  A1c 06-19-17 6.0, stable and unchanged from prior on 11-28-16 where A1c was also 6.0. He states he has cut back  from sugars about 50%, as well as carbs. He drinks mostly water, but also orange juice, wine, and beer.   Exercise: He has been playing golf, doing yoga, and doing step exercise videos (about 30 minutes).   Diet He is now drinking 4-5 bottles of water per day. He eats a lot of popcorn, which is salty.   Neuropathy From last OV, pt complains of bilateral neuropathy in his toes. He states since then, it has not gotten worse.    Hernia Pt has had 3 hernias in the past (two inguinal, one abdominal) and was concerned if he might have another one. He states he cannot feel anything other than a soreness in the creases of his groin. He thinks recently he is more constipated and thought it might be that.      Wt Readings from Last 3 Encounters:  06/26/17 163 lb 9.6 oz (74.2 kg)  03/26/17 153 lb (69.4 kg)  12/31/16 166 lb 4.8 oz (75.4 kg)   BP Readings from Last 3 Encounters:  06/26/17 106/60  03/26/17 120/72  12/31/16 120/75   Pulse Readings from Last 3 Encounters:  06/26/17 90  12/31/16 70  12/12/16 63   BMI Readings from Last 3 Encounters:  06/26/17 23.99 kg/m  03/26/17 21.95 kg/m  12/31/16 24.38 kg/m     Patient Care Team    Relationship Specialty Notifications Start End  [name], DO PCP - General Family Medicine  11/26/16   Specialists, [place]  Orthopedic Surgery  11/26/16   Druscilla Brownie, MD Consulting Physician Dermatology  11/26/16   [name], MD Consulting Physician Urology  11/26/16   [name], MD Consulting Physician Gastroenterology  11/26/16      Patient Active Problem List   Diagnosis Date Noted  . Glucose intolerance (impaired glucose tolerance) 12/12/2016    Priority: High  . CKD (chronic kidney disease), stage III ([place]) 12/12/2016    Priority: High  . Vitamin D deficiency 06/26/2017  . Plantar fasciitis of left foot 03/26/2017  . 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 Elevated PSA 11/28/2016  . Generalized  OA 11/26/2016  . BPH (benign prostatic hyperplasia) 11/26/2016  . Environmental and seasonal allergies 11/26/2016  . h/o Inguinal hernia- b/l and abd hernia repaired with mesh 11/26/2016  . S/P colonoscopy 11/26/2016  . h/o Renal stones- ca oxalate.  11/26/2016  . Cervicalgia 11/26/2016  . Peripheral neuropathy 11/26/2016    Past Medical history, Surgical history, Family history, Social history, Allergies and Medications have been entered into the medical record, reviewed and changed as needed.    Current Meds  Medication Sig  . Cholecalciferol (VITAMIN D-3) 5000 units TABS Take 1 tablet by mouth every morning.  . ferrous sulfate 325 (65 FE) MG tablet Take 325 mg by mouth daily with breakfast.  . fexofenadine (ALLEGRA) 180 MG tablet Take 180 mg by mouth 3 (three) times a week. Jan- March.   . meloxicam (MOBIC) 15 MG tablet Take 15 mg by mouth daily as needed for pain.  . Menaquinone-7 (VITAMIN K2) 100 MCG CAPS Take by mouth.  . Multiple Vitamin (MULTIVITAMIN WITH MINERALS) TABS tablet Take 1 tablet by mouth daily.  Jonna Coup Leaf Extract 150 MG CAPS Take by mouth.  . Omega-3 Fatty Acids (OMEGA 3 PO) Take 1 tablet by mouth every morning.  . pyridOXINE (VITAMIN B-6) 100 MG tablet Take 100 mg by mouth daily.  . tamsulosin (FLOMAX) 0.4 MG CAPS capsule Take 0.4 mg by mouth every morning.  . Testosterone 10 MG/ACT (2%) GEL Apply 1 application topically daily.  . Turmeric 450 MG CAPS Take 1 capsule by mouth daily.  . vitamin A 10000 UNIT capsule Take 10,000 Units by mouth daily.    Allergies:  No Known Allergies   Review of Systems:  A fourteen system review of systems was performed and found to be positive as per HPI.   Objective:   Blood pressure 106/60, pulse 90, temperature 99.9 F (37.7 C), height 5' 9.25" (1.759 m), weight 163 lb 9.6 oz (74.2 kg), SpO2 98 %. Body mass index is 23.99 kg/m. General:  Well Developed, well nourished, appropriate for stated age.  Neuro:  Alert and  oriented,  extra-ocular muscles intact  HEENT:  Normocephalic, atraumatic, neck supple, no carotid bruits appreciated  Skin:  no gross rash, warm, pink. Cardiac:  RRR, S1 S2 Respiratory:  ECTA B/L and A/P, Not using accessory muscles, speaking in full sentences- unlabored. Vascular:  Ext warm, no cyanosis apprec.; cap RF less 2 sec. Psych:  No HI/SI, judgement and insight good, Euthymic mood. Full Affect.

## 2017-06-26 NOTE — Patient Instructions (Addendum)

## 2017-08-21 DIAGNOSIS — K080 Exfoliation of teeth due to systemic causes: Secondary | ICD-10-CM | POA: Diagnosis not present

## 2017-09-12 ENCOUNTER — Encounter: Payer: Self-pay | Admitting: Family Medicine

## 2017-09-12 ENCOUNTER — Ambulatory Visit: Payer: Federal, State, Local not specified - PPO | Admitting: Family Medicine

## 2017-09-12 VITALS — BP 113/73 | HR 70 | Ht 69.25 in | Wt 160.5 lb

## 2017-09-12 DIAGNOSIS — E559 Vitamin D deficiency, unspecified: Secondary | ICD-10-CM | POA: Diagnosis not present

## 2017-09-12 DIAGNOSIS — M62838 Other muscle spasm: Secondary | ICD-10-CM

## 2017-09-12 DIAGNOSIS — N183 Chronic kidney disease, stage 3 unspecified: Secondary | ICD-10-CM

## 2017-09-12 DIAGNOSIS — R202 Paresthesia of skin: Secondary | ICD-10-CM | POA: Diagnosis not present

## 2017-09-12 DIAGNOSIS — R7302 Impaired glucose tolerance (oral): Secondary | ICD-10-CM | POA: Diagnosis not present

## 2017-09-12 DIAGNOSIS — G6289 Other specified polyneuropathies: Secondary | ICD-10-CM

## 2017-09-12 DIAGNOSIS — M47812 Spondylosis without myelopathy or radiculopathy, cervical region: Secondary | ICD-10-CM | POA: Diagnosis not present

## 2017-09-12 DIAGNOSIS — G5691 Unspecified mononeuropathy of right upper limb: Secondary | ICD-10-CM | POA: Diagnosis not present
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
## 2017-09-18 DIAGNOSIS — M62838 Other muscle spasm: Secondary | ICD-10-CM | POA: Insufficient documentation

## 2017-11-18 DIAGNOSIS — L814 Other melanin hyperpigmentation: Secondary | ICD-10-CM | POA: Diagnosis not present

## 2017-11-18 DIAGNOSIS — D1801 Hemangioma of skin and subcutaneous tissue: Secondary | ICD-10-CM | POA: Diagnosis not present

## 2017-11-18 DIAGNOSIS — D225 Melanocytic nevi of trunk: Secondary | ICD-10-CM | POA: Diagnosis not present

## 2017-11-18 DIAGNOSIS — L821 Other seborrheic keratosis: Secondary | ICD-10-CM | POA: Diagnosis not present

## 2017-11-18 DIAGNOSIS — L570 Actinic keratosis: Secondary | ICD-10-CM | POA: Diagnosis not present

## 2017-12-09 DIAGNOSIS — H2513 Age-related nuclear cataract, bilateral: Secondary | ICD-10-CM | POA: Diagnosis not present

## 2017-12-09 DIAGNOSIS — H43392 Other vitreous opacities, left eye: Secondary | ICD-10-CM | POA: Diagnosis not present

## 2017-12-25 ENCOUNTER — Other Ambulatory Visit: Payer: Federal, State, Local not specified - PPO

## 2017-12-25 DIAGNOSIS — G5691 Unspecified mononeuropathy of right upper limb: Secondary | ICD-10-CM | POA: Diagnosis not present

## 2017-12-25 DIAGNOSIS — R7302 Impaired glucose tolerance (oral): Secondary | ICD-10-CM | POA: Diagnosis not present

## 2017-12-25 DIAGNOSIS — E559 Vitamin D deficiency, unspecified: Secondary | ICD-10-CM

## 2017-12-25 DIAGNOSIS — R202 Paresthesia of skin: Secondary | ICD-10-CM | POA: Diagnosis not present

## 2017-12-25 DIAGNOSIS — N183 Chronic kidney disease, stage 3 unspecified: Secondary | ICD-10-CM

## 2017-12-25 DIAGNOSIS — G6289 Other specified polyneuropathies: Secondary | ICD-10-CM

## 2017-12-26 LAB — CBC WITH DIFFERENTIAL/PLATELET
BASOS ABS: 0 10*3/uL (ref 0.0–0.2)
BASOS: 1 %
EOS (ABSOLUTE): 0.3 10*3/uL (ref 0.0–0.4)
Eos: 7 %
Hematocrit: 43.8 % (ref 37.5–51.0)
Hemoglobin: 15.3 g/dL (ref 13.0–17.7)
IMMATURE GRANS (ABS): 0 10*3/uL (ref 0.0–0.1)
Immature Granulocytes: 0 %
LYMPHS ABS: 1.5 10*3/uL (ref 0.7–3.1)
Lymphs: 35 %
MCH: 33.4 pg — AB (ref 26.6–33.0)
MCHC: 34.9 g/dL (ref 31.5–35.7)
MCV: 96 fL (ref 79–97)
MONOS ABS: 0.3 10*3/uL (ref 0.1–0.9)
Monocytes: 7 %
NEUTROS ABS: 2.2 10*3/uL (ref 1.4–7.0)
Neutrophils: 50 %
PLATELETS: 242 10*3/uL (ref 150–450)
RBC: 4.58 x10E6/uL (ref 4.14–5.80)
RDW: 13.6 % (ref 12.3–15.4)
WBC: 4.4 10*3/uL (ref 3.4–10.8)

## 2017-12-26 LAB — MAGNESIUM: MAGNESIUM: 2.1 mg/dL (ref 1.6–2.3)

## 2017-12-26 LAB — VITAMIN D 25 HYDROXY (VIT D DEFICIENCY, FRACTURES): VIT D 25 HYDROXY: 70.1 ng/mL (ref 30.0–100.0)

## 2017-12-26 LAB — LIPID PANEL
CHOLESTEROL TOTAL: 142 mg/dL (ref 100–199)
Chol/HDL Ratio: 2.4 ratio (ref 0.0–5.0)
HDL: 59 mg/dL (ref >39)
LDL CALC: 75 mg/dL (ref 0–99)
TRIGLYCERIDES: 38 mg/dL (ref 0–149)
VLDL CHOLESTEROL CAL: 8 mg/dL (ref 5–40)

## 2017-12-26 LAB — TSH: TSH: 0.891 u[IU]/mL (ref 0.450–4.500)

## 2017-12-26 LAB — SEDIMENTATION RATE: Sed Rate: 2 mm/hr (ref 0–30)

## 2017-12-26 LAB — VITAMIN B12: VITAMIN B 12: 742 pg/mL (ref 232–1245)

## 2017-12-26 LAB — PHOSPHORUS: Phosphorus: 3.1 mg/dL (ref 2.5–4.5)

## 2017-12-26 LAB — T4, FREE: Free T4: 1.21 ng/dL (ref 0.82–1.77)

## 2017-12-26 LAB — T3, FREE: T3, Free: 3 pg/mL (ref 2.0–4.4)

## 2017-12-30 ENCOUNTER — Ambulatory Visit: Payer: Federal, State, Local not specified - PPO | Admitting: Family Medicine

## 2017-12-30 ENCOUNTER — Encounter: Payer: Self-pay | Admitting: Family Medicine

## 2017-12-30 VITALS — BP 124/71 | HR 73 | Ht 69.0 in | Wt 157.2 lb

## 2017-12-30 DIAGNOSIS — E559 Vitamin D deficiency, unspecified: Secondary | ICD-10-CM

## 2017-12-30 DIAGNOSIS — R7302 Impaired glucose tolerance (oral): Secondary | ICD-10-CM | POA: Diagnosis not present

## 2017-12-30 DIAGNOSIS — M159 Polyosteoarthritis, unspecified: Secondary | ICD-10-CM | POA: Diagnosis not present

## 2017-12-30 DIAGNOSIS — E538 Deficiency of other specified B group vitamins: Secondary | ICD-10-CM | POA: Insufficient documentation

## 2017-12-30 DIAGNOSIS — D509 Iron deficiency anemia, unspecified: Secondary | ICD-10-CM

## 2017-12-30 DIAGNOSIS — N183 Chronic kidney disease, stage 3 unspecified: Secondary | ICD-10-CM

## 2017-12-30 LAB — POCT GLYCOSYLATED HEMOGLOBIN (HGB A1C): HEMOGLOBIN A1C: 5.6 % (ref 4.0–5.6)

## 2017-12-30 NOTE — Patient Instructions (Addendum)
Jesus [name] please add an A1c and a CMP- please call [facility] this morning about this.   -We will call you about the results of your kidney and liver function as well as the A1c in the near future in approximately 3 days or so.  Jesus [name], pt wants to be called on his cell regarding the results since he will be out of town.    Nine ways to increase your "good" HDL cholesterol  High-density lipoprotein (HDL) is often referred to as the "good" cholesterol. Having high HDL levels helps carry cholesterol from your arteries to your liver, where it can be used or excreted.  Having high levels of HDL also has antioxidant and anti-inflammatory effects, and is linked to a reduced risk of heart disease (1, 2).  Most health experts recommend minimum blood levels of 40 mg/dl in men and 50 mg/dl in women.  While genetics definitely play a role, there are several other factors that affect HDL levels.  Here are nine healthy ways to raise your "good" HDL cholesterol.  1. Consume olive oil  two pieces of salmon on a plate olive oil being poured into a small dish Extra virgin olive oil may be more healthful than processed olive oils. Olive oil is one of the healthiest fats around.  A large analysis of 42 studies with more than 800,000 participants found that olive oil was the only source of monounsaturated fat that seemed to reduce heart disease risk (3).  Research has shown that one of olive oil's heart-healthy effects is an increase in HDL cholesterol. This effect is thought to be caused by antioxidants it contains called polyphenols (4, 5, 6, 7).  Extra virgin olive oil has more polyphenols than more processed olive oils, although the amount can still vary among different types and brands.  One study gave 200 healthy young men about 2 tablespoons (25 ml) of different olive oils per day for three weeks.  The researchers found that participants' HDL levels increased significantly more after they consumed  the olive oil with the highest polyphenol content (6).  In another study, when 53 older adults consumed about 4 tablespoons (50 ml) of high-polyphenol extra virgin olive oil every day for six weeks, their HDL cholesterol increased by 6.5 mg/dl, on average (7).  In addition to raising HDL levels, olive oil has been found to boost HDL's anti-inflammatory and antioxidant function in studies of older people and individuals with high cholesterol levels ( 7, 8, 9).  Whenever possible, select high-quality, certified extra virgin olive oils, which tend to be highest in polyphenols.  Bottom line: Extra virgin olive oil with a high polyphenol content has been shown to increase HDL levels in healthy people, the elderly and individuals with high cholesterol.  2. Follow a low-carb or ketogenic diet  Low-carb and ketogenic diets provide a number of health benefits, including weight loss and reduced blood sugar levels.  They have also been shown to increase HDL cholesterol in people who tend to have lower levels.  This includes those who are obese, insulin resistant or diabetic (10, 11, 12, 13, 14, 15, 16, 17).  In one study, people with type 2 diabetes were split into two groups.  One followed a diet consuming less than 50 grams of carbs per day. The other followed a high-carb diet.  Although both groups lost weight, the low-carb group's HDL cholesterol increased almost twice as much as the high-carb group's did (14).  In another study, obese people who followed a low-carb  diet experienced an increase in HDL cholesterol of 5 mg/dl overall.  Meanwhile, in the same study, the participants who ate a low-fat, high-carb diet showed a decrease in HDL cholesterol (15).  This response may partially be due to the higher levels of fat people typically consume on low-carb diets.  One study in overweight women found that diets high in meat and cheese increased HDL levels by 5-8%, compared to a higher-carb diet  (18).  What's more, in addition to raising HDL cholesterol, very-low-carb diets have been shown to decrease triglycerides and improve several other risk factors for heart disease (13, 14, 16, 17).  Bottom line: Low-carb and ketogenic diets typically increase HDL cholesterol levels in people with diabetes, metabolic syndrome and obesity.  3. Exercise regularly  Being physically active is important for heart health.  Studies have shown that many different types of exercise are effective at raising HDL cholesterol, including strength training, high-intensity exercise and aerobic exercise (19, 20, 21, 22, 23, 24).  However, the biggest increases in HDL are typically seen with high-intensity exercise.  One small study followed women who were living with polycystic ovary syndrome (PCOS), which is linked to a higher risk of insulin resistance. The study required them to perform high-intensity exercise three times a week.  The exercise led to an increase in HDL cholesterol of 8 mg/dL after 10 weeks. The women also showed improvements in other health markers, including decreased insulin resistance and improved arterial function (23).  In a 12-week study, overweight men who performed high-intensity exercise experienced a 10% increase in HDL cholesterol.  In contrast, the low-intensity exercise group showed only a 2% increase and the endurance training group experienced no change (24).  However, even lower-intensity exercise seems to increase HDL's anti-inflammatory and antioxidant capacities, whether or not HDL levels change (20, 21, 25).  Overall, high-intensity exercise such as high-intensity interval training (HIIT) and high-intensity circuit training (HICT) may boost HDL cholesterol levels the most.  Bottom line: Exercising several times per week can help raise HDL cholesterol and enhance its anti-inflammatory and antioxidant effects. High-intensity forms of exercise may be especially  effective.  4. Add coconut oil to your diet  Studies have shown that coconut oil may reduce appetite, increase metabolic rate and help protect brain health, among other benefits.  Some people may be concerned about coconut oil's effects on heart health due to its high saturated fat content.  However, it appears that coconut oil is actually quite heart healthy.  Coconut oil tends to raise HDL cholesterol more than many other types of fat.  In addition, it may improve the ratio of low-density-lipoprotein (LDL) cholesterol, the "bad" cholesterol, to HDL cholesterol. Improving this ratio reduces heart disease risk (26, 27, 28, 29).  One study examined the health effects of coconut oil on 69 women with excess belly fat. The researchers found that participants who took coconut oil daily experienced increased HDL cholesterol and a lower LDL-to-HDL ratio.  In contrast, the group who took soybean oil daily had a decrease in HDL cholesterol and an increase in the LDL-to-HDL ratio (29).  Most studies have found these health benefits occur at a dosage of about 2 tablespoons (30 ml) of coconut oil per day. It's best to incorporate this into cooking rather than eating spoonfuls of coconut oil on their own.  Bottom line: Consuming 2 tablespoons (30 ml) of coconut oil per day may help increase HDL cholesterol levels.  5. Stop smoking  cigarette butt Quitting smoking can reduce  the risk of heart disease and lung cancer. Smoking increases the risk of many health problems, including heart disease and lung cancer (30).  One of its negative effects is a suppression of HDL cholesterol.  Some studies have found that quitting smoking can increase HDL levels. Indeed, one study found no significant differences in HDL levels between former smokers and people who had never smoked (31, 32, 33, 34, 35).  In a one-year study of more than 1,500 people, those who quit smoking had twice the increase in HDL as those  who resumed smoking within the year. The number of large HDL particles also increased, which further reduced heart disease risk (32).  One study followed smokers who switched from traditional cigarettes to electronic cigarettes for one year. They found that the switch was associated with an increase in HDL cholesterol of 5 mg/dl, on average (33).  When it comes to the effect of nicotine replacement patches on HDL levels, research results have been mixed.  One study found that nicotine replacement therapy led to higher HDL cholesterol. However, other research suggests that people who use nicotine patches likely won't see increases in HDL levels until after replacement therapy is completed (34, 36).  Even in studies where HDL cholesterol levels didn't increase after people quit smoking, HDL function improved, resulting in less inflammation and other beneficial effects on heart health (37).  Bottom line: Quitting smoking can increase HDL levels, improve HDL function and help protect heart health.  6. Lose weight  When overweight and obese people lose weight, their HDL cholesterol levels usually increase.  What's more, this benefit seems to occur whether weight loss is achieved by calorie counting, carb restriction, intermittent fasting, weight loss surgery or a combination of diet and exercise (16, 38, 39, 40, 41, 42).  One study examined HDL levels in more than 3,000 overweight and obese [place] adults who followed a lifestyle modification program for one year.  The researchers found that losing at least 6.6 lbs (3 kg) led to an increase in HDL cholesterol of 4 mg/dl, on average (41).  In another study, when obese people with type 2 diabetes consumed calorie-restricted diets that provided 20-30% of calories from protein, they experienced significant increases in HDL cholesterol levels (42).  The key to achieving and maintaining healthy HDL cholesterol levels is choosing the type of diet that  makes it easiest for you to lose weight and keep it off.  Bottom Line: Several methods of weight loss have been shown to increase HDL cholesterol levels in people who are overweight or obese.  7. Choose purple produce  Consuming purple-colored fruits and vegetables is a delicious way to potentially increase HDL cholesterol.  Purple produce contains antioxidants known as anthocyanins.  Studies using anthocyanin extracts have shown that they help fight inflammation, protect your cells from damaging free radicals and may also raise HDL cholesterol levels (43, 44, 45, 46).  In a 24-week study of 53 people with diabetes, those who took an anthocyanin supplement twice a day experienced a 19% increase in HDL cholesterol, on average, along with other improvements in heart health markers (45).  In another study, when people with cholesterol issues took anthocyanin extract for 12 weeks, their HDL cholesterol levels increased by 13.7% (46).  Although these studies used extracts instead of foods, there are several fruits and vegetables that are very high in anthocyanins. These include eggplant, purple corn, red cabbage, blueberries, blackberries and black raspberries.  Bottom line: Consuming fruits and vegetables rich in  anthocyanins may help increase HDL cholesterol levels.  8. Eat fatty fish often  The omega-3 fats in fatty fish provide major benefits to heart health, including a reduction in inflammation and better functioning of the cells that line your arteries (47, 48).  There's some research showing that eating fatty fish or taking fish oil may also help raise low levels of HDL cholesterol (49, 50, 51, 52, 53).  In a study of 33 heart disease patients, participants that consumed fatty fish four times per week experienced an increase in HDL cholesterol levels. The particle size of their HDL also increased (52).  In another study, overweight men who consumed herring five days a week for six  weeks had a 5% increase in HDL cholesterol, compared with their levels after eating lean pork and chicken five days a week (53).  However, there are a few studies that found no increase in HDL cholesterol in response to increased fish or omega-3 supplement intake (54, 55).  In addition to herring, other types of fatty fish that may help raise HDL cholesterol include salmon, sardines, mackerel and anchovies.  Bottom line: Eating fatty fish several times per week may help increase HDL cholesterol levels and provide other benefits to heart health.  9. Avoid artificial trans fats  Artificial trans fats have many negative health effects due to their inflammatory properties (56, 57).  There are two types of trans fats. One kind occurs naturally in animal products, including full-fat dairy.  In contrast, the artificial trans fats found in margarines and processed foods are created by adding hydrogen to unsaturated vegetable and seed oils. These fats are also known as industrial trans fats or partially hydrogenated fats.  Research has shown that, in addition to increasing inflammation and contributing to several health problems, these artificial trans fats may lower HDL cholesterol levels.  In one study, researchers compared how people's HDL levels responded when they consumed different margarines.  The study found that participants' HDL cholesterol levels were 10% lower after consuming margarine containing partially hydrogenated soybean oil, compared to their levels after consuming palm oil (58).  Another controlled study followed 40 adults who had diets high in different types of trans fats.  They found that HDL cholesterol levels in women were significantly lower after they consumed the diet high in industrial trans fats, compared to the diet containing naturally occurring trans fats (59).  To protect heart health and keep HDL cholesterol in the healthy range, it's best to avoid artificial trans  fats altogether.  Bottom line: Artificial trans fats have been shown to lower HDL levels and increase inflammation, compared to other fats.  Take home message  Although your HDL cholesterol levels are partly determined by your genetics, there are many things you can do to naturally increase your own levels.  Fortunately, the practices that raise HDL cholesterol often provide other health benefits as well.

## 2017-12-30 NOTE — Progress Notes (Signed)
Assessment and plan:  1. CKD (chronic kidney disease), stage III ([place])   2. Glucose intolerance (impaired glucose tolerance)   3. Generalized OA   4. Vitamin D deficiency   5. Iron deficiency anemia, unspecified iron deficiency anemia type   6. h/o B12 deficiency     All recent labs reviewed with patient today.  Asked CMA to add on A1c and CMP to patient's labs as I am not sure why they were not added initially. -Once CMP obtained, will call patient about results and status of his chronic kidney disease.  -Prediabetes: A1c was 6.0 approximately 6 months ago.  5.6 today.  This was done in the office.  This is much improved.  -Patient will continue his Mobic only as needed.  Encouraged he can take a half a tab and see if that works as well.  Patient will continue exercise, prudent diet and keep his weight down.  -Vitamin D is now in normal range.  He will continue supplementation.  -Advised patient that he was no longer showing evidence of anemia.  Labs were stable.  Continue supplementation as he is currently taking.    Education and routine counseling performed. Handouts provided.  Orders Placed This Encounter  Procedures  . Comprehensive metabolic panel  . POCT glycosylated hemoglobin (Hb A1C)     Return for come in for yrly P Exam in near future- around 3 mo from now.   Anticipatory guidance and routine counseling done re: condition, txmnt options and need for follow up. All questions of patient's were answered.   Gross side effects, risk and benefits, and alternatives of medications discussed with patient.  Patient is aware that all medications have potential side effects and we are unable to predict every sideeffect or drug-drug interaction that may occur.  Expresses verbal understanding and consents to current therapy plan and treatment regiment.  Please see AVS handed out to patient at the end of our  visit for additional patient instructions/ counseling done pertaining to today's office visit.  Note: This document was prepared using Dragon voice recognition software and may include unintentional dictation errors.    [name] 12/30/17 1:09 PM  ----------------------------------------------------------------------------------------------------------------------  Subjective:   CC:   Jesus [name] is a 72 y.o. male who presents to [place] at [facility] today for review and discussion of recent bloodwork that was done, and review chronic conditions.  1. All recent blood work that we ordered was reviewed with patient today.  Patient was counseled on all abnormalities and we discussed dietary and lifestyle changes that could help those values (also medications when appropriate).  Extensive health counseling performed and all patient's concerns/ questions were addressed.  2. Patient has been doing well has no complaints today.  He continues to exercise and be very active - playing golf 5 days a week-  walks the course which is approximately 6 miles. 3. He is been going to urology and been getting testosterone supplementation.  His levels have been well.  He is using a piece to his chest every day.  His libido has significantly improved on this and he continues to be sexually active with his wife 2-3 times per week.  He is very happy with his treatment.  He gets yearly digital rectal exams from them 4. He continues to go to Dr. Allyson Sabal for yearly skin screenings, he has no concerns or new information to provide to me today.    Wt  Readings from Last 3 Encounters:  12/30/17 157 lb 3.2 oz (71.3 kg)  09/12/17 160 lb 8 oz (72.8 kg)  06/26/17 163 lb 9.6 oz (74.2 kg)   BP Readings from Last 3 Encounters:  12/30/17 124/71  09/12/17 113/73  06/26/17 106/60   Pulse Readings from Last 3 Encounters:  12/30/17 73  09/12/17 70  06/26/17 90   BMI Readings from Last 3  Encounters:  12/30/17 23.21 kg/m  09/12/17 23.53 kg/m  06/26/17 23.99 kg/m     Patient Care Team    Relationship Specialty Notifications Start End  [name], DO PCP - General Family Medicine  11/26/16   Specialists, [place]  Orthopedic Surgery  11/26/16   Druscilla Brownie, MD Consulting Physician Dermatology  11/26/16   [name], MD Consulting Physician Urology  11/26/16   [name], MD Consulting Physician Gastroenterology  11/26/16     Full medical history updated and reviewed in the office today  Patient Active Problem List   Diagnosis Date Noted  . Glucose intolerance (impaired glucose tolerance) 12/12/2016    Priority: High  . CKD (chronic kidney disease), stage III ([place]) 12/12/2016    Priority: High  . Iron deficiency anemia 12/30/2017  . h/o B12 deficiency 12/30/2017  . Muscle spasms- b/l upper traps 09/18/2017  . Tingling in extremities 09/12/2017  . 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 Neuropathy of forearm, right 09/12/2017  . Arthropathy of cervical facet joint 09/12/2017  . Vitamin D deficiency 06/26/2017  . Plantar fasciitis of left foot 03/26/2017  . 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 Elevated PSA 11/28/2016  . Generalized OA 11/26/2016  . BPH (benign prostatic hyperplasia) 11/26/2016  . Environmental and seasonal allergies 11/26/2016  . h/o Inguinal hernia- b/l and abd hernia repaired with mesh 11/26/2016  . S/P colonoscopy 11/26/2016  . h/o Renal stones- ca oxalate.  11/26/2016  . Cervicalgia 11/26/2016  . Peripheral neuropathy 11/26/2016    Past Medical History:  Diagnosis Date  . Arthritis   . BPH (benign prostatic hyperplasia)   . ED (erectile dysfunction)   . Elevated PSA   . History of acute renal failure    08/ 2017  . History of adenomatous polyp of colon    2005 TUBULAR ADENOMA  . History of herpes genitalis   . Hypogonadism, testicular   . Lower urinary tract symptoms (LUTS)   .  Nephrolithiasis    multiple right side non-obstructive per ct 11-27-2015  . Peyronie's disease   . Right ureteral stone     Past Surgical History:  Procedure Laterality Date  . ABDOMINAL HERNIA REPAIR  02/2014  . COLONOSCOPY WITH PROPOFOL N/A 06/06/2015   Procedure: COLONOSCOPY WITH PROPOFOL;  Surgeon: [name], MD;  Location: [facility];  Service: Endoscopy;  Laterality: N/A;  . CYSTOSCOPY WITH RETROGRADE PYELOGRAM, URETEROSCOPY AND STENT PLACEMENT Right 11/30/2015   Procedure: CYSTOSCOPY/RETROGRADE/URETEROSCOPY/;  Surgeon: [name], MD;  [facility];  Service: Urology;  Laterality: Right;  . CYSTOSCOPY/URETEROSCOPY/HOLMIUM LASER/STENT PLACEMENT Right 12/20/2015   Procedure: CYSTOSCOPY/URETEROSCOPY/HOLMIUM LASER/ REMOVE RIGHT JJ STENT;  Surgeon: [name], MD;  Location: [facility];  Service: Urology;  Laterality: Right;  1 HOUR  . INGUINAL HERNIA REPAIR Left 2001  . KNEE ARTHROSCOPY Left 2005  . NESBIT PLICATION FOR PEYRONIE'S DISEASE  05/01/2013   and Right Inguinal Hernia Repair  . TONSILLECTOMY  child    Social History   Tobacco Use  . Smoking status: Never Smoker  .  Smokeless tobacco: Never Used  Substance Use Topics  . Alcohol use: Yes    Alcohol/week: 4.0 standard drinks    Types: 4 Cans of beer per week    Family Hx: Family History  Problem Relation Age of Onset  . Alcohol abuse Mother   . Cancer Father        Brain  . Cancer Sister        breast  . Hypertension Paternal Grandmother   . Depression Paternal Grandmother        suicide     Medications: Current Outpatient Medications  Medication Sig Dispense Refill  . Cholecalciferol (VITAMIN D-3) 5000 units TABS Take 1 tablet by mouth every morning.    . ferrous sulfate 325 (65 FE) MG tablet Take 325 mg by mouth daily with breakfast.    . fexofenadine (ALLEGRA) 180 MG tablet Take 180 mg by mouth 3 (three) times a week. Jan- March.     . meloxicam  (MOBIC) 15 MG tablet Take 15 mg by mouth daily as needed for pain.    . Menaquinone-7 (VITAMIN K2) 100 MCG CAPS Take by mouth.    . Multiple Vitamin (MULTIVITAMIN WITH MINERALS) TABS tablet Take 1 tablet by mouth daily.    Jonna Coup Leaf Extract 150 MG CAPS Take by mouth.    . Omega-3 Fatty Acids (OMEGA 3 PO) Take 1 tablet by mouth every morning.    . pyridOXINE (VITAMIN B-6) 100 MG tablet Take 100 mg by mouth daily.    . tamsulosin (FLOMAX) 0.4 MG CAPS capsule Take 0.4 mg by mouth every morning.  3  . Testosterone 10 MG/ACT (2%) GEL Apply 1 application topically daily.    . Turmeric 450 MG CAPS Take 1 capsule by mouth daily.    . vitamin A 10000 UNIT capsule Take 10,000 Units by mouth daily.     No current facility-administered medications for this visit.     Allergies:  No Known Allergies   Review of Systems: General:   No F/C, wt loss Pulm:   No DIB, SOB, pleuritic chest pain Card:  No CP, palpitations Abd:  No n/v/d or pain Ext:  No inc edema from baseline  Objective:  Blood pressure 124/71, pulse 73, height 5\' 9"  (1.753 m), weight 157 lb 3.2 oz (71.3 kg), SpO2 98 %. Body mass index is 23.21 kg/m. Gen:   Well NAD, A and O *3 HEENT:    [place]/AT, EOMI,  MMM Lungs:   Normal work of breathing. CTA B/L, no Wh, rhonchi Heart:   RRR, S1, S2 WNL's, no MRG Abd:   No gross distention Exts:    warm, pink,  Brisk capillary refill, warm and well perfused.  Psych:    No HI/SI, judgement and insight good, Euthymic mood. Full Affect.   Recent Results (from the past 2160 hour(s))  Sedimentation rate     Status: None   Collection Time: 12/25/17  8:37 AM  Result Value Ref Range   Sed Rate 2 0 - 30 mm/hr  VITAMIN D 25 Hydroxy (Vit-D Deficiency, Fractures)     Status: None   Collection Time: 12/25/17  8:37 AM  Result Value Ref Range   Vit D, 25-Hydroxy 70.1 30.0 - 100.0 ng/mL    Comment: Vitamin D deficiency has been defined by the [facility] practice  guideline as a level of serum 25-OH vitamin D less than 20 ng/mL (1,2). The Endocrine Society went on to further define vitamin D  insufficiency as a level between 21 and 29 ng/mL (2). 1. IOM (Institute of Medicine). 2010. Dietary reference    intakes for calcium and D. [place]: The    [employer]. 2. Holick MF, Binkley , Bischoff-Ferrari HA, et al.    Evaluation, treatment, and prevention of vitamin D    deficiency: an Endocrine Society clinical practice    guideline. JCEM. 2011 Jul; 96(7):1911-30.   Vitamin B12     Status: None   Collection Time: 12/25/17  8:37 AM  Result Value Ref Range   Vitamin B-12 742 232 - 1,245 pg/mL  Magnesium     Status: None   Collection Time: 12/25/17  8:37 AM  Result Value Ref Range   Magnesium 2.1 1.6 - 2.3 mg/dL  Phosphorus     Status: None   Collection Time: 12/25/17  8:37 AM  Result Value Ref Range   Phosphorus 3.1 2.5 - 4.5 mg/dL  T3, free     Status: None   Collection Time: 12/25/17  8:37 AM  Result Value Ref Range   T3, Free 3.0 2.0 - 4.4 pg/mL  T4, free     Status: None   Collection Time: 12/25/17  8:37 AM  Result Value Ref Range   Free T4 1.21 0.82 - 1.77 ng/dL  TSH     Status: None   Collection Time: 12/25/17  8:37 AM  Result Value Ref Range   TSH 0.891 0.450 - 4.500 uIU/mL  Lipid panel     Status: None   Collection Time: 12/25/17  8:37 AM  Result Value Ref Range   Cholesterol, Total 142 100 - 199 mg/dL   Triglycerides 38 0 - 149 mg/dL   HDL 59 >39 mg/dL   VLDL Cholesterol Cal 8 5 - 40 mg/dL   LDL Calculated 75 0 - 99 mg/dL   Chol/HDL Ratio 2.4 0.0 - 5.0 ratio    Comment:                                   T. Chol/HDL Ratio                                             Men  Women                               1/2 Avg.Risk  3.4    3.3                                   Avg.Risk  5.0    4.4                                2X Avg.Risk  9.6    7.1                                3X Avg.Risk 23.4   11.0   CBC with  Differential/Platelet     Status: Abnormal   Collection Time: 12/25/17  8:37 AM  Result Value Ref Range   WBC 4.4 3.4 - 10.8  x10E3/uL   RBC 4.58 4.14 - 5.80 x10E6/uL   Hemoglobin 15.3 13.0 - 17.7 g/dL   Hematocrit 43.8 37.5 - 51.0 %   MCV 96 79 - 97 fL   MCH 33.4 (H) 26.6 - 33.0 pg   MCHC 34.9 31.5 - 35.7 g/dL   RDW 13.6 12.3 - 15.4 %   Platelets 242 150 - 450 x10E3/uL   Neutrophils 50 Not Estab. %   Lymphs 35 Not Estab. %   Monocytes 7 Not Estab. %   Eos 7 Not Estab. %   Basos 1 Not Estab. %   Neutrophils Absolute 2.2 1.4 - 7.0 x10E3/uL   Lymphocytes Absolute 1.5 0.7 - 3.1 x10E3/uL   Monocytes Absolute 0.3 0.1 - 0.9 x10E3/uL   EOS (ABSOLUTE) 0.3 0.0 - 0.4 x10E3/uL   Basophils Absolute 0.0 0.0 - 0.2 x10E3/uL   Immature Granulocytes 0 Not Estab. %   Immature Grans (Abs) 0.0 0.0 - 0.1 x10E3/uL  POCT glycosylated hemoglobin (Hb A1C)     Status: Normal   Collection Time: 12/30/17 10:12 AM  Result Value Ref Range   Hemoglobin A1C 5.6 4.0 - 5.6 %   HbA1c POC (<> result, manual entry)     HbA1c, POC (prediabetic range)     HbA1c, POC (controlled diabetic range)

## 2018-01-01 ENCOUNTER — Telehealth: Payer: Self-pay | Admitting: Family Medicine

## 2018-01-01 LAB — CMP12+1AC
ALT: 17 IU/L (ref 0–44)
AST: 21 IU/L (ref 0–40)
Albumin/Globulin Ratio: 2 (ref 1.2–2.2)
Albumin: 4.1 g/dL (ref 3.5–4.8)
Alkaline Phosphatase: 47 IU/L (ref 39–117)
BUN/Creatinine Ratio: 13 (ref 10–24)
BUN: 16 mg/dL (ref 8–27)
Bilirubin Total: 0.4 mg/dL (ref 0.0–1.2)
CALCIUM: 9.3 mg/dL (ref 8.6–10.2)
CREATININE: 1.26 mg/dL (ref 0.76–1.27)
Chloride: 104 mmol/L (ref 96–106)
GFR calc Af Amer: 65 mL/min/{1.73_m2} (ref >59)
GFR, EST NON AFRICAN AMERICAN: 56 mL/min/{1.73_m2} — AB (ref >59)
Globulin, Total: 2.1 g/dL (ref 1.5–4.5)
Glucose: 124 mg/dL — ABNORMAL HIGH (ref 65–99)
Potassium: 4.7 mmol/L (ref 3.5–5.2)
Sodium: 144 mmol/L (ref 134–144)
TOTAL PROTEIN: 6.2 g/dL (ref 6.0–8.5)

## 2018-01-01 LAB — SPECIMEN STATUS REPORT

## 2018-01-01 NOTE — Telephone Encounter (Signed)
Patient states missed call from med asst./Melissa request cb with level readings of kidney function& want to know his numbers , the [facility] & what is level was last year.  --Forwarding message to medical assistant.  --Patient states will be home until 9am then has errands & won't return until  12 noon --glh

## 2018-01-12 DIAGNOSIS — R310 Gross hematuria: Secondary | ICD-10-CM | POA: Diagnosis not present

## 2018-01-12 DIAGNOSIS — N200 Calculus of kidney: Secondary | ICD-10-CM | POA: Diagnosis not present

## 2018-01-13 ENCOUNTER — Encounter (HOSPITAL_COMMUNITY): Payer: Self-pay | Admitting: *Deleted

## 2018-01-13 ENCOUNTER — Ambulatory Visit (HOSPITAL_COMMUNITY): Payer: Federal, State, Local not specified - PPO | Admitting: Anesthesiology

## 2018-01-13 ENCOUNTER — Ambulatory Visit (HOSPITAL_COMMUNITY)
Admission: AD | Admit: 2018-01-13 | Discharge: 2018-01-13 | Disposition: A | Discharge status: Home / Self Care | Payer: Federal, State, Local not specified - PPO | Source: Other Acute Inpatient Hospital | Attending: Urology | Admitting: Urology

## 2018-01-13 ENCOUNTER — Ambulatory Visit (HOSPITAL_COMMUNITY): Payer: Federal, State, Local not specified - PPO

## 2018-01-13 ENCOUNTER — Other Ambulatory Visit: Payer: Self-pay | Admitting: Urology

## 2018-01-13 ENCOUNTER — Encounter (HOSPITAL_COMMUNITY)
Admission: AD | Disposition: A | Discharge status: Home / Self Care | Payer: Self-pay | Source: Other Acute Inpatient Hospital | Attending: Urology

## 2018-01-13 DIAGNOSIS — Z803 Family history of malignant neoplasm of breast: Secondary | ICD-10-CM | POA: Diagnosis not present

## 2018-01-13 DIAGNOSIS — Z87442 Personal history of urinary calculi: Secondary | ICD-10-CM | POA: Insufficient documentation

## 2018-01-13 DIAGNOSIS — K8020 Calculus of gallbladder without cholecystitis without obstruction: Secondary | ICD-10-CM | POA: Diagnosis not present

## 2018-01-13 DIAGNOSIS — N183 Chronic kidney disease, stage 3 (moderate): Secondary | ICD-10-CM | POA: Diagnosis not present

## 2018-01-13 DIAGNOSIS — M1990 Unspecified osteoarthritis, unspecified site: Secondary | ICD-10-CM | POA: Insufficient documentation

## 2018-01-13 DIAGNOSIS — N132 Hydronephrosis with renal and ureteral calculous obstruction: Secondary | ICD-10-CM | POA: Insufficient documentation

## 2018-01-13 DIAGNOSIS — Z8042 Family history of malignant neoplasm of prostate: Secondary | ICD-10-CM | POA: Diagnosis not present

## 2018-01-13 DIAGNOSIS — Z79899 Other long term (current) drug therapy: Secondary | ICD-10-CM | POA: Diagnosis not present

## 2018-01-13 DIAGNOSIS — E559 Vitamin D deficiency, unspecified: Secondary | ICD-10-CM | POA: Diagnosis not present

## 2018-01-13 DIAGNOSIS — N201 Calculus of ureter: Secondary | ICD-10-CM | POA: Diagnosis not present

## 2018-01-13 DIAGNOSIS — N200 Calculus of kidney: Secondary | ICD-10-CM | POA: Diagnosis not present

## 2018-01-13 DIAGNOSIS — N202 Calculus of kidney with calculus of ureter: Secondary | ICD-10-CM | POA: Diagnosis not present

## 2018-01-13 HISTORY — PX: CYSTOSCOPY/URETEROSCOPY/HOLMIUM LASER/STENT PLACEMENT: SHX6546

## 2018-01-13 HISTORY — DX: Personal history of urinary calculi: Z87.442

## 2018-01-13 SURGERY — CYSTOSCOPY/URETEROSCOPY/HOLMIUM LASER/STENT PLACEMENT
Anesthesia: General | Site: Ureter | Laterality: Right

## 2018-01-13 MED ORDER — KETOROLAC TROMETHAMINE 15 MG/ML IJ SOLN
INTRAMUSCULAR | Status: DC | PRN
  Administered 2018-01-13: 15 mg via INTRAVENOUS

## 2018-01-13 MED ORDER — CEFAZOLIN SODIUM-DEXTROSE 2-4 GM/100ML-% IV SOLN
2.0000 g | INTRAVENOUS | Status: AC
  Administered 2018-01-13: 2 g via INTRAVENOUS

## 2018-01-13 MED ORDER — ONDANSETRON HCL 4 MG/2ML IJ SOLN
INTRAMUSCULAR | Status: DC | PRN
  Administered 2018-01-13: 4 mg via INTRAVENOUS

## 2018-01-13 MED ORDER — TRAMADOL HCL 50 MG PO TABS: 50.0000 mg | ORAL_TABLET | Freq: Four times a day (QID) | ORAL | 0 refills | Status: DC | PRN

## 2018-01-13 MED ORDER — LIDOCAINE 2% (20 MG/ML) 5 ML SYRINGE
INTRAMUSCULAR | Status: DC | PRN
  Administered 2018-01-13: 80 mg via INTRAVENOUS

## 2018-01-13 MED ORDER — FENTANYL CITRATE (PF) 100 MCG/2ML IJ SOLN: 25.0000 ug | INTRAMUSCULAR | Status: DC | PRN

## 2018-01-13 MED ORDER — OXYCODONE HCL 5 MG PO TABS: 5.0000 mg | ORAL_TABLET | Freq: Once | ORAL | Status: DC | PRN

## 2018-01-13 MED ORDER — EPHEDRINE SULFATE-NACL 50-0.9 MG/10ML-% IV SOSY
PREFILLED_SYRINGE | INTRAVENOUS | Status: DC | PRN
  Administered 2018-01-13: 10 mg via INTRAVENOUS
  Administered 2018-01-13: 5 mg via INTRAVENOUS

## 2018-01-13 MED ORDER — PROPOFOL 10 MG/ML IV BOLUS
INTRAVENOUS | Status: AC
  Filled 2018-01-13: qty 20

## 2018-01-13 MED ORDER — FENTANYL CITRATE (PF) 100 MCG/2ML IJ SOLN
INTRAMUSCULAR | Status: AC
  Filled 2018-01-13: qty 2

## 2018-01-13 MED ORDER — DEXAMETHASONE SODIUM PHOSPHATE 10 MG/ML IJ SOLN
INTRAMUSCULAR | Status: AC
  Filled 2018-01-13: qty 1

## 2018-01-13 MED ORDER — CEFAZOLIN SODIUM-DEXTROSE 2-4 GM/100ML-% IV SOLN
INTRAVENOUS | Status: AC
  Filled 2018-01-13: qty 100

## 2018-01-13 MED ORDER — PHENAZOPYRIDINE HCL 200 MG PO TABS: 200.0000 mg | ORAL_TABLET | Freq: Three times a day (TID) | ORAL | 0 refills | Status: DC | PRN

## 2018-01-13 MED ORDER — OXYCODONE HCL 5 MG/5ML PO SOLN
5.0000 mg | Freq: Once | ORAL | Status: DC | PRN
  Filled 2018-01-13: qty 5

## 2018-01-13 MED ORDER — LIDOCAINE 2% (20 MG/ML) 5 ML SYRINGE
INTRAMUSCULAR | Status: AC
  Filled 2018-01-13: qty 5

## 2018-01-13 MED ORDER — PROPOFOL 10 MG/ML IV BOLUS
INTRAVENOUS | Status: DC | PRN
  Administered 2018-01-13: 150 mg via INTRAVENOUS

## 2018-01-13 MED ORDER — CIPROFLOXACIN HCL 500 MG PO TABS: 500.0000 mg | ORAL_TABLET | Freq: Once | ORAL | 0 refills | Status: AC

## 2018-01-13 MED ORDER — FENTANYL CITRATE (PF) 100 MCG/2ML IJ SOLN
INTRAMUSCULAR | Status: DC | PRN
  Administered 2018-01-13 (×2): 25 ug via INTRAVENOUS

## 2018-01-13 MED ORDER — DEXAMETHASONE SODIUM PHOSPHATE 10 MG/ML IJ SOLN
INTRAMUSCULAR | Status: DC | PRN
  Administered 2018-01-13: 10 mg via INTRAVENOUS

## 2018-01-13 MED ORDER — ONDANSETRON HCL 4 MG/2ML IJ SOLN
INTRAMUSCULAR | Status: AC
  Filled 2018-01-13: qty 2

## 2018-01-13 MED ORDER — SODIUM CHLORIDE 0.9 % IR SOLN
Status: DC | PRN
  Administered 2018-01-13: 3000 mL

## 2018-01-13 MED ORDER — KETOROLAC TROMETHAMINE 30 MG/ML IJ SOLN
INTRAMUSCULAR | Status: AC
  Filled 2018-01-13: qty 1

## 2018-01-13 MED ORDER — EPHEDRINE 5 MG/ML INJ
INTRAVENOUS | Status: AC
  Filled 2018-01-13: qty 10

## 2018-01-13 MED ORDER — LACTATED RINGERS IV SOLN
INTRAVENOUS | Status: DC
  Administered 2018-01-13: 15:00:00 via INTRAVENOUS

## 2018-01-13 MED ORDER — IOHEXOL 300 MG/ML  SOLN
INTRAMUSCULAR | Status: DC | PRN
  Administered 2018-01-13: 4 mL

## 2018-01-13 SURGICAL SUPPLY — 19 items
BAG URO CATCHER STRL LF (MISCELLANEOUS) ×2 IMPLANT
BASKET ZERO TIP NITINOL 2.4FR (BASKET) IMPLANT
CATH URET 5FR 28IN OPEN ENDED (CATHETERS) ×2 IMPLANT
CLOTH BEACON ORANGE TIMEOUT ST (SAFETY) ×2 IMPLANT
EXTRACTOR STONE 1.7FRX115CM (UROLOGICAL SUPPLIES) IMPLANT
EXTRACTOR STONE NITINOL NGAGE (UROLOGICAL SUPPLIES) ×2 IMPLANT
FIBER LASER TRAC TIP (UROLOGICAL SUPPLIES) ×2 IMPLANT
GLOVE BIOGEL M STRL SZ7.5 (GLOVE) ×2 IMPLANT
GOWN STRL REUS W/TWL XL LVL3 (GOWN DISPOSABLE) ×2 IMPLANT
GUIDEWIRE ANG ZIPWIRE 038X150 (WIRE) IMPLANT
GUIDEWIRE STR DUAL SENSOR (WIRE) ×2 IMPLANT
MANIFOLD NEPTUNE II (INSTRUMENTS) ×2 IMPLANT
PACK CYSTO (CUSTOM PROCEDURE TRAY) ×2 IMPLANT
SHEATH URETERAL 12FRX28CM (UROLOGICAL SUPPLIES) IMPLANT
SHEATH URETERAL 12FRX35CM (MISCELLANEOUS) IMPLANT
STENT URET 6FRX26 CONTOUR (STENTS) ×2 IMPLANT
TUBING CONNECTING 10 (TUBING) ×2 IMPLANT
TUBING UROLOGY SET (TUBING) ×2 IMPLANT
WIRE COONS/BENSON .038X145CM (WIRE) IMPLANT

## 2018-01-13 NOTE — Anesthesia Procedure Notes (Signed)
Procedure Name: LMA Insertion Date/Time: 01/13/2018 5:39 PM Performed by: West Pugh, CRNA Pre-anesthesia Checklist: Patient identified, Emergency Drugs available, Suction available, Patient being monitored and Timeout performed Patient Re-evaluated:Patient Re-evaluated prior to induction Oxygen Delivery Method: Circle system utilized Preoxygenation: Pre-oxygenation with 100% oxygen Induction Type: IV induction Ventilation: Mask ventilation without difficulty LMA: LMA inserted LMA Size: 5.0 Number of attempts: 2 Placement Confirmation: positive ETCO2 and breath sounds checked- equal and bilateral Tube secured with: Tape Dental Injury: Teeth and Oropharynx as per pre-operative assessment

## 2018-01-13 NOTE — Transfer of Care (Signed)
Immediate Anesthesia Transfer of Care Note  Patient: Jesus [name]  Procedure(s) Performed: CYSTOSCOPY/URETEROSCOPY/HOLMIUM LASER/STONE REMOVAL/STENT PLACEMENT (Right Ureter)  Patient Location: PACU  Anesthesia Type:General  Level of Consciousness: awake, alert , oriented and patient cooperative  Airway & Oxygen Therapy: Patient Spontanous Breathing and Patient connected to face mask oxygen  Post-op Assessment: Report given to RN and Post -op Vital signs reviewed and stable  Post vital signs: Reviewed and stable  Last Vitals:  Vitals Value Taken Time  BP 131/67 01/13/2018  6:28 PM  Temp    Pulse 68 01/13/2018  6:29 PM  Resp 13 01/13/2018  6:29 PM  SpO2 100 % 01/13/2018  6:29 PM  Vitals shown include unvalidated device data.  Last Pain:  Vitals:   01/13/18 1443  TempSrc: Oral  PainSc: 0-No pain         Complications: No apparent anesthesia complications

## 2018-01-13 NOTE — H&P (Signed)
I have a history of kidney stones.  HPI: Jesus [name] is a 72 year-old male established patient who is here for F/U due to a history of renal calculi.  He did not pass a stone since the last office visit. The patient has been taking Tamsulosin for their calculus disease.   The patient has had flank pain since they were last seen. The patient denies any progressive voiding symptoms. He has yes seen blood in his urine since the last visit. He is not currently having flank pain, back pain, groin pain, nausea, vomiting, fever or chills.   C/o painless gross hematuria with voiding last night and again this morning. Also associated with mild right lower back discomfort described as pressure by the patient. This is not bothersome and does not limit him from performing normal activities. He remains on tamsulosin prescribed previously for obstructive voiding symptoms. Denies burning or painful urination. Denies increased frequency/urgency, changes in urine stream, interval stone material passage. Afebrile and denies other constitutional signs or symptoms of infection.   Intv: Patient presents today with worsening pain, nausea, and sweats. The patient was seen yesterday and was unable to tolerate his symptoms. He is seen again today and had a CT scan prior. This demonstrates a 5 mm stone in the right mid ureter. He had severe nausea and vomiting. He denies any fevers or chills.     ALLERGIES: No Allergies    MEDICATIONS: Tamsulosin Hcl 0.4 mg capsule TAKE ONE CAPSULE BY MOUTH DAILY  Dilaudid  Multi-Day Vitamins TABS Oral  Prostate Health CAPS Oral  Testosterone 10 MG/ACT (2%) Transdermal Gel Apply 4 pumps to shoulders and chest area daily  Valacyclovir 500 mg tablet 0 Oral     GU PSH: Cysto Uretero Lithotripsy, Right - 2017 Cystoscopy Insert Stent, Right - 2017 Cystoscopy Ureteroscopy, Right - 2017 Peyronies Plaque Incision - 2008      [place] Notes: Knee surgery   NON-GU PSH: Hernia Repair - 2008     GU PMH: Gross hematuria - 01/12/2018 Urinary Urgency (Stable) - 06/26/2017, - 12/25/2016 Nocturia - 2018 Renal calculus - 2018 Ureteral calculus (Acute), Right, Stat BUN/creatine Slight progression of right proximal ureteral calculus. Now at mid ureter L4. Recommend proceeding with urgent cystourethroscopy, (R) RPG, possible stone extraction, and double J stent placement with worsening creatine now 2.5 - 2017 ED due to arterial insufficiency - 2017, Erectile dysfunction due to arterial insufficiency, - 2016 Primary hypogonadism - 2017, Hypogonadism, testicular, - 2017 BPH w/LUTS, Benign prostatic hyperplasia with urinary obstruction - 2017 Weak Urinary Stream, Weak urinary stream - 2017 Peyronies Disease, Peyronie's disease - 2014    NON-GU PMH: Encounter for general adult medical examination without abnormal findings, Encounter for preventive health examination - 2017 Herpesviral infection of urogenital system, unspecified, Herpes, genital - 2014 Arthritis, Arthritis - 2014    FAMILY HISTORY: Brain Cancer - Mother Breast Cancer - Sister Family Health Status Number - Sister Prostate Cancer - Father   SOCIAL HISTORY: Marital Status: Single Preferred Language: English; Ethnicity: Not Hispanic Or Latino; Race: White Current Smoking Status: Patient has never smoked.  Drinks 2 drinks per week. Social Drinker.  Does not drink caffeine.     Notes: Death In The Family Mother   REVIEW OF SYSTEMS:    GU Review Male:   Patient denies hard to postpone urination, erection problems, stream starts and stops, have to strain to urinate , trouble starting your stream, penile pain, frequent urination, get up at night to urinate, leakage of  urine, and burning/ pain with urination.  Gastrointestinal (Upper):   Patient reports nausea. Patient denies vomiting and indigestion/ heartburn.  Gastrointestinal (Lower):   Patient denies diarrhea and constipation.  Constitutional:   Patient reports night sweats.  Patient denies fever, weight loss, and fatigue.  Skin:   Patient denies skin rash/ lesion and itching.  Eyes:   Patient denies blurred vision and double vision.  Ears/ Nose/ Throat:   Patient denies sore throat and sinus problems.  Hematologic/Lymphatic:   Patient denies swollen glands and easy bruising.  Cardiovascular:   Patient denies leg swelling and chest pains.  Respiratory:   Patient denies cough and shortness of breath.  Endocrine:   Patient denies excessive thirst.  Musculoskeletal:   Patient denies back pain and joint pain.  Neurological:   Patient denies headaches and dizziness.  Psychologic:   Patient denies depression and anxiety.   VITAL SIGNS:      01/13/2018 09:58 AM  BP 134/74 mmHg  Pulse 60 /min  Temperature 98.4 F / 36.8 C   MULTI-SYSTEM PHYSICAL EXAMINATION:    Constitutional: Well-nourished. No physical deformities. Normally developed. Good grooming.  Respiratory: Normal breath sounds. No labored breathing, no use of accessory muscles.   Cardiovascular: Regular rate and rhythm. No murmur, no gallop. Normal temperature, normal extremity pulses, no swelling, no varicosities.   Musculoskeletal: Normal gait and station of head and neck.     PAST DATA REVIEWED:  Source Of History:  Patient  Records Review:   Previous Doctor Records, Previous Patient Records, POC Tool  X-Ray Review: C.T. Abdomen/Pelvis: Reviewed Films. Discussed With Patient.     06/17/17 12/17/16 10/13/15 09/13/14 03/09/14 03/02/13 08/25/12 11/28/11  PSA  Total PSA 2.75 ng/mL 2.68 ng/mL 2.11 ng/dl 2.31  2.35  2.37  2.28  1.70     06/17/17 12/17/16 06/11/16 10/13/15 03/21/15 09/13/14 03/08/14 08/30/13  Hormones  Testosterone, Total 502.9 ng/dL 391.5 ng/dL 157.0 pg/dL 1405.4 pg/dL 735  735  360  1000     PROCEDURES:         C.T. Urogram - [id number]               Urinalysis w/Scope Dipstick Dipstick Cont'd Micro  Color: Yellow Bilirubin: Neg mg/dL WBC/hpf: NS (Not Seen)  Appearance: Cloudy  Ketones: Trace mg/dL RBC/hpf: >60/hpf  Specific Gravity: 1.015 Blood: 3+ ery/uL Bacteria: Rare (0-9/hpf)  pH: 8.0 Protein: 1+ mg/dL Cystals: Ca Oxalate  Glucose: Neg mg/dL Urobilinogen: 0.2 mg/dL Casts: NS (Not Seen)    Nitrites: Neg Trichomonas: Not Present    Leukocyte Esterase: Neg leu/uL Mucous: Not Present      Epithelial Cells: NS (Not Seen)      Yeast: NS (Not Seen)      Sperm: Not Present         Notes:   Impression: 4.5 mm stone in the right mid ureter with proximal hydroureteronephrosis. There are no other appreciable stones within the collecting system.   ASSESSMENT:      ICD-10 Details  1 GU:   Ureteral calculus - N20.1    PLAN:            Medications New Meds: Oxycodone Hcl 5 mg tablet 1-2 tablet PO Q 6 H   #20  0 Refill(s)            Document Letter(s):  Created for Patient: Clinical Summary    I went over the treatment options for their stone. We discussed ongoing medical expulsion therapy, ESWL and ureteroscopy. Ultimately the patient  and I agreed that ureterscopy is the best option. I went over this surgery with the patient in detail. The patient understands after being put to sleep, we would proceed with a telescope to access the stone and potentially use a laser to fragment the stone before removing it with a basket. After removing the stone the patient will require temporary stent placement in the ureter. This is an outpatient procedure. I also discussed the potential of not being able to gain access safely into the ureter/kidney. This would require that a stent be placed and then the patient rescheduled several weeks later for a second attempt. They also understand the small risks of ureteral trauma causing a stricture or permanent damage. I also explained the risk of urinary tract infection. Having gone over the procedure itself, the expected outcome, and the risks/benefits the patient has agreed to proceed.

## 2018-01-13 NOTE — Discharge Instructions (Signed)
DISCHARGE INSTRUCTIONS FOR KIDNEY STONE/URETERAL STENT   MEDICATIONS:  1.  Resume all your other meds from home - except do not take any extra narcotic pain meds that you may have at home.  2. Pyridium is to help with the burning/stinging when you urinate. 3. Tramadol is for moderate/severe pain, otherwise taking upto 1000 mg every 6 hours of plainTylenol will help treat your pain.   4. Take Cipro one hour prior to removal of your stent.   ACTIVITY:  1. No strenuous activity x 1week  2. No driving while on narcotic pain medications  3. Drink plenty of water  4. Continue to walk at home - you can still get blood clots when you are at home, so keep active, but don't over do it.  5. May return to work/school tomorrow or when you feel ready   BATHING:  1. You can shower and we recommend daily showers  2. You have a string coming from your urethra: The stent string is attached to your ureteral stent. Do not pull on this.   SIGNS/SYMPTOMS TO CALL:  Please call us if you have a fever greater than 101.5, uncontrolled nausea/vomiting, uncontrolled pain, dizziness, unable to urinate, bloody urine, chest pain, shortness of breath, leg swelling, leg pain, redness around wound, drainage from wound, or any other concerns or questions.   You can reach [place] at [phone].   FOLLOW-UP:  1. You have an appointment in 6 weeks with a ultrasound of your kidneys prior.   2. Unless you object, the previously scheduled appointment will be kept with Dr. [name] on 11/6.  The u/s will be done prior.   3. You have a string attached to your stent, you may remove it on Sunday, 10/6. To do this, pull the strings until the stents are completely removed. You may feel an odd sensation in your back.

## 2018-01-13 NOTE — Anesthesia Preprocedure Evaluation (Signed)
Anesthesia Evaluation  Patient identified by MRN, date of birth, ID band Patient awake    Reviewed: Allergy & Precautions, H&P , NPO status , Patient's Chart, lab work & pertinent test results  History of Anesthesia Complications Negative for: history of anesthetic complications  Airway Mallampati: II  TM Distance: >3 FB Neck ROM: full    Dental  (+) Teeth Intact   Pulmonary neg pulmonary ROS,    breath sounds clear to auscultation       Cardiovascular negative cardio ROS   Rhythm:regular Rate:Normal     Neuro/Psych negative neurological ROS  negative psych ROS   GI/Hepatic   Endo/Other    Renal/GU Renal diseasestones     Musculoskeletal  (+) Arthritis ,   Abdominal   Peds  Hematology   Anesthesia Other Findings   Reproductive/Obstetrics                             Anesthesia Physical Anesthesia Plan  ASA: I  Anesthesia Plan: General   Post-op Pain Management:    Induction: Intravenous  PONV Risk Score and Plan: 2 and Ondansetron and Dexamethasone  Airway Management Planned: LMA  Additional Equipment: None  Intra-op Plan:   Post-operative Plan: Extubation in OR  Informed Consent: I have reviewed the patients History and Physical, chart, labs and discussed the procedure including the risks, benefits and alternatives for the proposed anesthesia with the patient or authorized representative who has indicated his/her understanding and acceptance.   Dental advisory given  Plan Discussed with: CRNA and Surgeon  Anesthesia Plan Comments:         Anesthesia Quick Evaluation

## 2018-01-13 NOTE — Op Note (Signed)
Preoperative Diagnosis: Right nephrolithiasis  Postoperative Diagnosis:  Same  Procedure(s) Performed:   - Cystourethroscopy - Right ureteroscopic stone extraction with laser lithotripsy - Right retrograde pyelogram - Right ureteral stent removal  - Right ureteral stent placement - Intraoperative fluoroscopy with interpretation <1hr.   Teaching Surgeon:  Louis Meckel, MD  Resident Surgeon:  Fredricka Bonine, MD  Assistant(s):  None  Anesthesia:  General  Fluids:  See anesthesia record  Estimated blood loss:  0cc  Specimens:  Stone for analysis  Drains:  Right 6Fr x 26cm JJ ureteral stent with dangler  Complications:  None  Indications: 72 y.o. patient with a history of a 6 mm obstructing Right mid to proximal ureteral stone w/ refractory pain, nausea and vomiting. He is here today for ureteroscopic stone extraction. Risks & benefits of the procedure discussed with the patient, who wishes to proceed.  Findings:   Stone located within distal right ureter, ~1 cm proximal to UVJ Distal ureter was narrow at this location, tight lumen just proximal to true UVJ Successful laser lithotripsy of stone and complete basket extraction Temporary ureteral stent with string left in place  Radiologic Interpretation of Retrograde Pyelogram: Right retrograde pyelogram demonstrated no contrast extravasation, filling defects. There was mild proximal hydroureteronephrosis. Distal ureteral stone was not readily visible on plain film.   Description:  The patient was correctly identified in the preop holding area where written informed consent as well potential risk and complication reviewed. The patient agreed. They were brought back to the operative suite where a preinduction timeout was performed. Once correct information was verified, general anesthesia was induced. They were then gently placed into dorsal lithotomy position with SCDs in place for VTE prophylaxis. They were prepped and draped  in the usual sterile fashion and given appropriate preoperative antibiotics. A second timeout was then performed.   We inserted a 18F rigid cystoscope per urethra with copious lubrication and normal saline irrigation running. This demonstrated findings as described above.    We cannulated the right ureteral orifice with a 5Fr open ended catheter. A retrograde pyelogram was performed with findings as noted above. We then replaced the sensor wire into the right kidney and the 5Fr open ended catheter was removed. We then removed the cystoscope leaving our sensor wire in place.  We transitioned to a semi-rigid ureteroscope and cannulated the right ureter along side our wire. We immediately encountered a ureteral stone about 1-2 cm proximal to the UVJ.   Wethen obtained a 365 micron holmium laser fiber and performed laser lithotripsy until all of the stone burden was fragmented into several small fragments which were basket extracted using a wire basket without complication. Stone fragments were sent for chemical analysis. We then re-explored the location of stone treatment which demonstrated no remaining stones. We performed ureteroscopy to the level of the proximal ureter which was clear. At this point we elected to leave a ureteral stent and withdrew our instruments leaving a sensor wire in place. We then advanced a 6Fr x 26cm JJ ureteral stent with dangler with the assistance of a stent pusher under direct fluoroscopic & visual guidance without difficultly. Sensor wire removal demonstrated satisfactory stent curl proximally in the renal pelvis and distally in the bladder. The bladder was emptied and all instrumentation was removed. The stent string was affixed to the penile shaft with benzoin and tegaderm. The patient was woken up from anesthesia and taken to the recovery unit for routine postoperative care.   Attestation:  Dr. Louis Meckel  was present and scrubbed for the entirety of the procedure.

## 2018-01-14 DIAGNOSIS — N201 Calculus of ureter: Secondary | ICD-10-CM | POA: Diagnosis not present

## 2018-01-15 ENCOUNTER — Encounter (HOSPITAL_COMMUNITY): Payer: Self-pay | Admitting: Urology

## 2018-01-15 NOTE — Anesthesia Postprocedure Evaluation (Signed)
Anesthesia Post Note  Patient: Jesus [name]  Procedure(s) Performed: CYSTOSCOPY/URETEROSCOPY/HOLMIUM LASER/STONE REMOVAL/STENT PLACEMENT (Right Ureter)     Patient location during evaluation: PACU Anesthesia Type: General Level of consciousness: awake and alert Pain management: pain level controlled Vital Signs Assessment: post-procedure vital signs reviewed and stable Respiratory status: spontaneous breathing, nonlabored ventilation, respiratory function stable and patient connected to nasal cannula oxygen Cardiovascular status: blood pressure returned to baseline and stable Postop Assessment: no apparent nausea or vomiting Anesthetic complications: no    Last Vitals:  Vitals:   01/13/18 1918 01/13/18 1930  BP: (!) 149/73 127/69  Pulse: (!) 59   Resp: 14   Temp: 36.7 C   SpO2: 99%     Last Pain:  Vitals:   01/13/18 1918  TempSrc:   PainSc: 0-No pain                 Venola Castello

## 2018-02-10 DIAGNOSIS — R350 Frequency of micturition: Secondary | ICD-10-CM | POA: Diagnosis not present

## 2018-02-10 DIAGNOSIS — N401 Enlarged prostate with lower urinary tract symptoms: Secondary | ICD-10-CM | POA: Diagnosis not present

## 2018-02-10 DIAGNOSIS — E291 Testicular hypofunction: Secondary | ICD-10-CM | POA: Diagnosis not present

## 2018-02-18 DIAGNOSIS — N401 Enlarged prostate with lower urinary tract symptoms: Secondary | ICD-10-CM | POA: Diagnosis not present

## 2018-02-18 DIAGNOSIS — R350 Frequency of micturition: Secondary | ICD-10-CM | POA: Diagnosis not present

## 2018-02-18 DIAGNOSIS — E291 Testicular hypofunction: Secondary | ICD-10-CM | POA: Diagnosis not present

## 2018-02-18 DIAGNOSIS — N202 Calculus of kidney with calculus of ureter: Secondary | ICD-10-CM | POA: Diagnosis not present

## 2018-02-26 DIAGNOSIS — K080 Exfoliation of teeth due to systemic causes: Secondary | ICD-10-CM | POA: Diagnosis not present

## 2018-03-31 DIAGNOSIS — E291 Testicular hypofunction: Secondary | ICD-10-CM | POA: Diagnosis not present

## 2018-04-02 ENCOUNTER — Ambulatory Visit (INDEPENDENT_AMBULATORY_CARE_PROVIDER_SITE_OTHER): Payer: Federal, State, Local not specified - PPO | Admitting: Family Medicine

## 2018-04-02 ENCOUNTER — Encounter: Payer: Self-pay | Admitting: Family Medicine

## 2018-04-02 VITALS — BP 112/73 | HR 62 | Temp 98.1°F | Ht 69.25 in | Wt 161.3 lb

## 2018-04-02 DIAGNOSIS — Z0000 Encounter for general adult medical examination without abnormal findings: Secondary | ICD-10-CM | POA: Diagnosis not present

## 2018-04-02 DIAGNOSIS — Z23 Encounter for immunization: Secondary | ICD-10-CM | POA: Diagnosis not present

## 2018-04-02 MED ORDER — ZOSTER VAC RECOMB ADJUVANTED 50 MCG/0.5ML IM SUSR: 0.5000 mL | Freq: Once | INTRAMUSCULAR | 0 refills | Status: AC

## 2018-04-02 NOTE — Patient Instructions (Addendum)
Preventive Care 40 Years and Older, Male Preventive care refers to lifestyle choices and visits with your health care provider that can promote health and wellness. What does preventive care include?   A yearly physical exam. This is also called an annual well check.  Dental exams once or twice a year.  Routine eye exams. Ask your health care provider how often you should have your eyes checked.  Personal lifestyle choices, including: ? Daily care of your teeth and gums. ? Regular physical activity. ? Eating a healthy diet. ? Avoiding tobacco and drug use. ? Limiting alcohol use. ? Practicing safe sex. ? Taking low doses of aspirin every day. ? Taking vitamin and mineral supplements as recommended by your health care provider. What happens during an annual well check? The services and screenings done by your health care provider during your annual well check will depend on your age, overall health, lifestyle risk factors, and family history of disease. Counseling Your health care provider may ask you questions about your:  Alcohol use.  Tobacco use.  Drug use.  Emotional well-being.  Home and relationship well-being.  Sexual activity.  Eating habits.  History of falls.  Memory and ability to understand (cognition).  Work and work Statistician. Screening You may have the following tests or measurements:  Height, weight, and BMI.  Blood pressure.  Lipid and cholesterol levels. These may be checked every 5 years, or more frequently if you are over 74 years old.  Skin check.  Lung cancer screening. You may have this screening every year starting at age 74 if you have a 30-pack-year history of smoking and currently smoke or have quit within the past 15 years.  Colorectal cancer screening. All adults should have this screening starting at age 30 and continuing until age 74. You will have tests every 1-10 years, depending on your results and the type of screening  test. People at increased risk should start screening at an earlier age. Screening tests may include: ? Guaiac-based fecal occult blood testing. ? Fecal immunochemical test (FIT). ? Stool DNA test. ? Virtual colonoscopy. ? Sigmoidoscopy. During this test, a flexible tube with a tiny camera (sigmoidoscope) is used to examine your rectum and lower colon. The sigmoidoscope is inserted through your anus into your rectum and lower colon. ? Colonoscopy. During this test, a long, thin, flexible tube with a tiny camera (colonoscope) is used to examine your entire colon and rectum.  Prostate cancer screening. Recommendations will vary depending on your family history and other risks.  Hepatitis C blood test.  Hepatitis B blood test.  Sexually transmitted disease (STD) testing.  Diabetes screening. This is done by checking your blood sugar (glucose) after you have not eaten for a while (fasting). You may have this done every 1-3 years.  Abdominal aortic aneurysm (AAA) screening. You may need this if you are a current or former smoker.  Osteoporosis. You may be screened starting at age 74 if you are at high risk. Talk with your health care provider about your test results, treatment options, and if necessary, the need for more tests. Vaccines Your health care provider may recommend certain vaccines, such as:  Influenza vaccine. This is recommended every year.  Tetanus, diphtheria, and acellular pertussis (Tdap, Td) vaccine. You may need a Td booster every 10 years.  Varicella vaccine. You may need this if you have not been vaccinated.  Zoster vaccine. You may need this after age 74.  Measles, mumps, and rubella (MMR)  vaccine. You may need at least one dose of MMR if you were born in 1957 or later. You may also need a second dose.  Pneumococcal 13-valent conjugate (PCV13) vaccine. One dose is recommended after age 74.  Pneumococcal polysaccharide (PPSV23) vaccine. One dose is recommended  after age 74.  Meningococcal vaccine. You may need this if you have certain conditions.  Hepatitis A vaccine. You may need this if you have certain conditions or if you travel or work in places where you may be exposed to hepatitis A.  Hepatitis B vaccine. You may need this if you have certain conditions or if you travel or work in places where you may be exposed to hepatitis B.  Haemophilus influenzae type b (Hib) vaccine. You may need this if you have certain risk factors. Talk to your health care provider about which screenings and vaccines you need and how often you need them. This information is not intended to replace advice given to you by your health care provider. Make sure you discuss any questions you have with your health care provider. Document Released: 04/28/2015 Document Revised: 05/22/2017 Document Reviewed: 01/31/2015 Elsevier Interactive Patient Education  2019 [place] for Adults, Male A healthy lifestyle and preventive care can promote health and wellness. Preventive health guidelines for men include the following key practices:  A routine yearly physical is a good way to check with your health care provider about your health and preventative screening. It is a chance to share any concerns and updates on your health and to receive a thorough exam.  Visit your dentist for a routine exam and preventative care every 6 months. Brush your teeth twice a day and floss once a day. Good oral hygiene prevents tooth decay and gum disease.  The frequency of eye exams is based on your age, health, family medical history, use of contact lenses, and other factors. Follow your health care provider's recommendations for frequency of eye exams.  Eat a healthy diet. Foods such as vegetables, fruits, whole grains, low-fat dairy products, and lean protein foods contain the nutrients you need without too many calories. Decrease your intake of foods high in solid fats,  added sugars, and salt. Eat the right amount of calories for you.Get information about a proper diet from your health care provider, if necessary.  Regular physical exercise is one of the most important things you can do for your health. Most adults should get at least 150 minutes of moderate-intensity exercise (any activity that increases your heart rate and causes you to sweat) each week. In addition, most adults need muscle-strengthening exercises on 2 or more days a week.  Maintain a healthy weight. The body mass index (BMI) is a screening tool to identify possible weight problems. It provides an estimate of body fat based on height and weight. Your health care provider can find your BMI and can help you achieve or maintain a healthy weight.For adults 20 years and older:  A BMI below 18.5 is considered underweight.  A BMI of 18.5 to 24.9 is normal.  A BMI of 25 to 29.9 is considered overweight.  A BMI of 30 and above is considered obese.  Maintain normal blood lipids and cholesterol levels by exercising and minimizing your intake of saturated fat. Eat a balanced diet with plenty of fruit and vegetables. Blood tests for lipids and cholesterol should begin at age 65 and be repeated every 5 years. If your lipid or cholesterol levels are  high, you are over 50, or you are at high risk for heart disease, you may need your cholesterol levels checked more frequently.Ongoing high lipid and cholesterol levels should be treated with medicines if diet and exercise are not working.  If you smoke, find out from your health care provider how to quit. If you do not use tobacco, do not start.  Lung cancer screening is recommended for adults aged 3-80 years who are at high risk for developing lung cancer because of a history of smoking. A yearly low-dose CT scan of the lungs is recommended for people who have at least a 30-pack-year history of smoking and are a current smoker or have quit within the past 15  years. A pack year of smoking is smoking an average of 1 pack of cigarettes a day for 1 year (for example: 1 pack a day for 30 years or 2 packs a day for 15 years). Yearly screening should continue until the smoker has stopped smoking for at least 15 years. Yearly screening should be stopped for people who develop a health problem that would prevent them from having lung cancer treatment.  If you choose to drink alcohol, do not have more than 2 drinks per day. One drink is considered to be 12 ounces (355 mL) of beer, 5 ounces (148 mL) of wine, or 1.5 ounces (44 mL) of liquor.  Avoid use of street drugs. Do not share needles with anyone. Ask for help if you need support or instructions about stopping the use of drugs.  High blood pressure causes heart disease and increases the risk of stroke. Your blood pressure should be checked at least every 1-2 years. Ongoing high blood pressure should be treated with medicines, if weight loss and exercise are not effective.  If you are 41-51 years old, ask your health care provider if you should take aspirin to prevent heart disease.  Diabetes screening is done by taking a blood sample to check your blood glucose level after you have not eaten for a certain period of time (fasting). If you are not overweight and you do not have risk factors for diabetes, you should be screened once every 3 years starting at age 72. If you are overweight or obese and you are 17-70 years of age, you should be screened for diabetes every year as part of your cardiovascular risk assessment.  Colorectal cancer can be detected and often prevented. Most routine colorectal cancer screening begins at the age of 22 and continues through age 27. However, your health care provider may recommend screening at an earlier age if you have risk factors for colon cancer. On a yearly basis, your health care provider may provide home test kits to check for hidden blood in the stool. Use of a small camera  at the end of a tube to directly examine the colon (sigmoidoscopy or colonoscopy) can detect the earliest forms of colorectal cancer. Talk to your health care provider about this at age 54, when routine screening begins. Direct exam of the colon should be repeated every 5-10 years through age 51, unless early forms of precancerous polyps or small growths are found.  People who are at an increased risk for hepatitis B should be screened for this virus. You are considered at high risk for hepatitis B if:  You were born in a country where hepatitis B occurs often. Talk with your health care provider about which countries are considered high risk.  Your parents were born in  a high-risk country and you have not received a shot to protect against hepatitis B (hepatitis B vaccine).  You have HIV or AIDS.  You use needles to inject street drugs.  You live with, or have sex with, someone who has hepatitis B.  You are a man who has sex with other men (MSM).  You get hemodialysis treatment.  You take certain medicines for conditions such as cancer, organ transplantation, and autoimmune conditions.  Hepatitis C blood testing is recommended for all people born from [birth year] through 1965 and any individual with known risks for hepatitis C.  Practice safe sex. Use condoms and avoid high-risk sexual practices to reduce the spread of sexually transmitted infections (STIs). STIs include gonorrhea, chlamydia, syphilis, trichomonas, herpes, HPV, and human immunodeficiency virus (HIV). Herpes, HIV, and HPV are viral illnesses that have no cure. They can result in disability, cancer, and death.  If you are a man who has sex with other men, you should be screened at least once per year for:  HIV.  Urethral, rectal, and pharyngeal infection of gonorrhea, chlamydia, or both.  If you are at risk of being infected with HIV, it is recommended that you take a prescription medicine daily to prevent HIV infection. This  is called preexposure prophylaxis (PrEP). You are considered at risk if:  You are a man who has sex with other men (MSM) and have other risk factors.  You are a heterosexual man, are sexually active, and are at increased risk for HIV infection.  You take drugs by injection.  You are sexually active with a partner who has HIV.  Talk with your health care provider about whether you are at high risk of being infected with HIV. If you choose to begin PrEP, you should first be tested for HIV. You should then be tested every 3 months for as long as you are taking PrEP.  A one-time screening for abdominal aortic aneurysm (AAA) and surgical repair of large AAAs by ultrasound are recommended for men ages 67 to 63 years who are current or former smokers.  Healthy men should no longer receive prostate-specific antigen (PSA) blood tests as part of routine cancer screening. Talk with your health care provider about prostate cancer screening.  Testicular cancer screening is not recommended for adult males who have no symptoms. Screening includes self-exam, a health care provider exam, and other screening tests. Consult with your health care provider about any symptoms you have or any concerns you have about testicular cancer.  Use sunscreen. Apply sunscreen liberally and repeatedly throughout the day. You should seek shade when your shadow is shorter than you. Protect yourself by wearing long sleeves, pants, a wide-brimmed hat, and sunglasses year round, whenever you are outdoors.  Once a month, do a whole-body skin exam, using a mirror to look at the skin on your back. Tell your health care provider about new moles, moles that have irregular borders, moles that are larger than a pencil eraser, or moles that have changed in shape or color.  Stay current with required vaccines (immunizations).  Influenza vaccine. All adults should be immunized every year.  Tetanus, diphtheria, and acellular pertussis (Td,  Tdap) vaccine. An adult who has not previously received Tdap or who does not know his vaccine status should receive 1 dose of Tdap. This initial dose should be followed by tetanus and diphtheria toxoids (Td) booster doses every 10 years. Adults with an unknown or incomplete history of completing a 3-dose immunization series with  Td-containing vaccines should begin or complete a primary immunization series including a Tdap dose. Adults should receive a Td booster every 10 years.  Varicella vaccine. An adult without evidence of immunity to varicella should receive 2 doses or a second dose if he has previously received 1 dose.  Human papillomavirus (HPV) vaccine. Males aged 11-21 years who have not received the vaccine previously should receive the 3-dose series. Males aged 22-26 years may be immunized. Immunization is recommended through the age of 73 years for any male who has sex with males and did not get any or all doses earlier. Immunization is recommended for any person with an immunocompromised condition through the age of 78 years if he did not get any or all doses earlier. During the 3-dose series, the second dose should be obtained 4-8 weeks after the first dose. The third dose should be obtained 24 weeks after the first dose and 16 weeks after the second dose.  Zoster vaccine. One dose is recommended for adults aged 28 years or older unless certain conditions are present.  Measles, mumps, and rubella (MMR) vaccine. Adults born before 60 generally are considered immune to measles and mumps. Adults born in [birth year] or later should have 1 or more doses of MMR vaccine unless there is a contraindication to the vaccine or there is laboratory evidence of immunity to each of the three diseases. A routine second dose of MMR vaccine should be obtained at least 28 days after the first dose for students attending postsecondary schools, health care workers, or international travelers. People who received  inactivated measles vaccine or an unknown type of measles vaccine during 1963-1967 should receive 2 doses of MMR vaccine. People who received inactivated mumps vaccine or an unknown type of mumps vaccine before 1979 and are at high risk for mumps infection should consider immunization with 2 doses of MMR vaccine. Unvaccinated health care workers born before [birth year] who lack laboratory evidence of measles, mumps, or rubella immunity or laboratory confirmation of disease should consider measles and mumps immunization with 2 doses of MMR vaccine or rubella immunization with 1 dose of MMR vaccine.  Pneumococcal 13-valent conjugate (PCV13) vaccine. When indicated, a person who is uncertain of his immunization history and has no record of immunization should receive the PCV13 vaccine. All adults 96 years of age and older should receive this vaccine. An adult aged 43 years or older who has certain medical conditions and has not been previously immunized should receive 1 dose of PCV13 vaccine. This PCV13 should be followed with a dose of pneumococcal polysaccharide (PPSV23) vaccine. Adults who are at high risk for pneumococcal disease should obtain the PPSV23 vaccine at least 8 weeks after the dose of PCV13 vaccine. Adults older than 74 years of age who have normal immune system function should obtain the PPSV23 vaccine dose at least 1 year after the dose of PCV13 vaccine.  Pneumococcal polysaccharide (PPSV23) vaccine. When PCV13 is also indicated, PCV13 should be obtained first. All adults aged 67 years and older should be immunized. An adult younger than age 39 years who has certain medical conditions should be immunized. Any person who resides in a nursing home or long-term care facility should be immunized. An adult smoker should be immunized. People with an immunocompromised condition and certain other conditions should receive both PCV13 and PPSV23 vaccines. People with human immunodeficiency virus (HIV) infection  should be immunized as soon as possible after diagnosis. Immunization during chemotherapy or radiation therapy should be avoided. Routine  use of PPSV23 vaccine is not recommended for American Indians, [place] Natives, or people younger than 65 years unless there are medical conditions that require PPSV23 vaccine. When indicated, people who have unknown immunization and have no record of immunization should receive PPSV23 vaccine. One-time revaccination 5 years after the first dose of PPSV23 is recommended for people aged 19-64 years who have chronic kidney failure, nephrotic syndrome, asplenia, or immunocompromised conditions. People who received 1-2 doses of PPSV23 before age 57 years should receive another dose of PPSV23 vaccine at age 39 years or later if at least 5 years have passed since the previous dose. Doses of PPSV23 are not needed for people immunized with PPSV23 at or after age 30 years.  Meningococcal vaccine. Adults with asplenia or persistent complement component deficiencies should receive 2 doses of quadrivalent meningococcal conjugate (MenACWY-D) vaccine. The doses should be obtained at least 2 months apart. Microbiologists working with certain meningococcal bacteria, [place] recruits, people at risk during an outbreak, and people who travel to or live in countries with a high rate of meningitis should be immunized. A first-year college student up through age 30 years who is living in a residence hall should receive a dose if he did not receive a dose on or after his 16th birthday. Adults who have certain high-risk conditions should receive one or more doses of vaccine.  Hepatitis A vaccine. Adults who wish to be protected from this disease, have chronic liver disease, work with hepatitis A-infected animals, work in hepatitis A research labs, or travel to or work in countries with a high rate of hepatitis A should be immunized. Adults who were previously unvaccinated and who anticipate close  contact with an international adoptee during the first 60 days after arrival in the [place] from a country with a high rate of hepatitis A should be immunized.  Hepatitis B vaccine. Adults should be immunized if they wish to be protected from this disease, are under age 57 years and have diabetes, have chronic liver disease, have had more than one sex partner in the past 6 months, may be exposed to blood or other infectious body fluids, are household contacts or sex partners of hepatitis B positive people, are clients or workers in certain care facilities, or travel to or work in countries with a high rate of hepatitis B.  Haemophilus influenzae type b (Hib) vaccine. A previously unvaccinated person with asplenia or sickle cell disease or having a scheduled splenectomy should receive 1 dose of Hib vaccine. Regardless of previous immunization, a recipient of a hematopoietic stem cell transplant should receive a 3-dose series 6-12 months after his successful transplant. Hib vaccine is not recommended for adults with HIV infection. Preventive Service / Frequency Ages 75 to 50  Blood pressure check.** / Every 3-5 years.  Lipid and cholesterol check.** / Every 5 years beginning at age 59.  Hepatitis C blood test.** / For any individual with known risks for hepatitis C.  Skin self-exam. / Monthly.  Influenza vaccine. / Every year.  Tetanus, diphtheria, and acellular pertussis (Tdap, Td) vaccine.** / Consult your health care provider. 1 dose of Td every 10 years.  Varicella vaccine.** / Consult your health care provider.  HPV vaccine. / 3 doses over 6 months, if 38 or younger.  Measles, mumps, rubella (MMR) vaccine.** / You need at least 1 dose of MMR if you were born in 1957 or later. You may also need a second dose.  Pneumococcal 13-valent conjugate (PCV13) vaccine.** /  Consult your health care provider.  Pneumococcal polysaccharide (PPSV23) vaccine.** / 1 to 2 doses if you smoke  cigarettes or if you have certain conditions.  Meningococcal vaccine.** / 1 dose if you are age 47 to 61 years and a Market researcher living in a residence hall, or have one of several medical conditions. You may also need additional booster doses.  Hepatitis A vaccine.** / Consult your health care provider.  Hepatitis B vaccine.** / Consult your health care provider.  Haemophilus influenzae type b (Hib) vaccine.** / Consult your health care provider. Ages 82 to 77  Blood pressure check.** / Every year.  Lipid and cholesterol check.** / Every 5 years beginning at age 23.  Lung cancer screening. / Every year if you are aged 83-80 years and have a 30-pack-year history of smoking and currently smoke or have quit within the past 15 years. Yearly screening is stopped once you have quit smoking for at least 15 years or develop a health problem that would prevent you from having lung cancer treatment.  Fecal occult blood test (FOBT) of stool. / Every year beginning at age 38 and continuing until age 3. You may not have to do this test if you get a colonoscopy every 10 years.  Flexible sigmoidoscopy** or colonoscopy.** / Every 5 years for a flexible sigmoidoscopy or every 10 years for a colonoscopy beginning at age 30 and continuing until age 22.  Hepatitis C blood test.** / For all people born from [birth year] through 1965 and any individual with known risks for hepatitis C.  Skin self-exam. / Monthly.  Influenza vaccine. / Every year.  Tetanus, diphtheria, and acellular pertussis (Tdap/Td) vaccine.** / Consult your health care provider. 1 dose of Td every 10 years.  Varicella vaccine.** / Consult your health care provider.  Zoster vaccine.** / 1 dose for adults aged 26 years or older.  Measles, mumps, rubella (MMR) vaccine.** / You need at least 1 dose of MMR if you were born in 1957 or later. You may also need a second dose.  Pneumococcal 13-valent conjugate (PCV13) vaccine.** /  Consult your health care provider.  Pneumococcal polysaccharide (PPSV23) vaccine.** / 1 to 2 doses if you smoke cigarettes or if you have certain conditions.  Meningococcal vaccine.** / Consult your health care provider.  Hepatitis A vaccine.** / Consult your health care provider.  Hepatitis B vaccine.** / Consult your health care provider.  Haemophilus influenzae type b (Hib) vaccine.** / Consult your health care provider. Ages 37 and over  Blood pressure check.** / Every year.  Lipid and cholesterol check.**/ Every 5 years beginning at age 10.  Lung cancer screening. / Every year if you are aged 53-80 years and have a 30-pack-year history of smoking and currently smoke or have quit within the past 15 years. Yearly screening is stopped once you have quit smoking for at least 15 years or develop a health problem that would prevent you from having lung cancer treatment.  Fecal occult blood test (FOBT) of stool. / Every year beginning at age 23 and continuing until age 36. You may not have to do this test if you get a colonoscopy every 10 years.  Flexible sigmoidoscopy** or colonoscopy.** / Every 5 years for a flexible sigmoidoscopy or every 10 years for a colonoscopy beginning at age 66 and continuing until age 45.  Hepatitis C blood test.** / For all people born from [birth year] through 1967 and any individual with known risks for hepatitis C.  Abdominal aortic  aneurysm (AAA) screening.** / A one-time screening for ages 53 to 58 years who are current or former smokers.  Skin self-exam. / Monthly.  Influenza vaccine. / Every year.  Tetanus, diphtheria, and acellular pertussis (Tdap/Td) vaccine.** / 1 dose of Td every 10 years.  Varicella vaccine.** / Consult your health care provider.  Zoster vaccine.** / 1 dose for adults aged 82 years or older.  Pneumococcal 13-valent conjugate (PCV13) vaccine.** / 1 dose for all adults aged 1 years and older.  Pneumococcal polysaccharide (PPSV23)  vaccine.** / 1 dose for all adults aged 66 years and older.  Meningococcal vaccine.** / Consult your health care provider.  Hepatitis A vaccine.** / Consult your health care provider.  Hepatitis B vaccine.** / Consult your health care provider.  Haemophilus influenzae type b (Hib) vaccine.** / Consult your health care provider. **Family history and personal history of risk and conditions may change your health care provider's recommendations.   This information is not intended to replace advice given to you by your health care provider. Make sure you discuss any questions you have with your health care provider.   Document Released: 05/28/2001 Document Revised: 04/22/2014 Document Reviewed: 08/27/2010 Elsevier Interactive Patient Education [employer].

## 2018-04-02 NOTE — Progress Notes (Signed)
Male physical  Impression and Recommendations:    1. Medicare annual wellness visit, subsequent   2. Need for shingles vaccine     1) Anticipatory Guidance: Discussed importance of wearing a seatbelt while driving, not texting while driving;   sunscreen when outside along with skin surveillance; eating a balanced and modest diet; physical activity at least 25 minutes per day or 150 min/ week moderate to intense activity.  2) Immunizations / Screenings / Labs:  All immunizations are up-to-date per recommendations or will be updated today. Patient is due for dental and vision screens which pt will schedule independently. Will obtain CBC, CMP, HgA1c, Lipid panel, TSH and vit D when fasting, if not already done recently.   - Patient knows to ask GI if concerned about repeating colonoscopy.  Per patient, 2017 was his last colonoscopy.  Hemoccult stool cards discussed, but per pt, he has had hemorrhoids in the past before.  - Advised patient to call his insurance and see if the shingles vaccine is covered.  - Per patient, believes he has had a pneumonia vaccine as recently as 2015.  - Patient has had his flu shot this year.  - Advised patient to continue following up with urology as recommended.  Per patient, he follows up with urology every six months, and obtains frontal and posterior exams there.  - Patient knows to follow up with dermatology for concerns and screenings as recommended.  3) Weight:   Improve nutrient density of diet through increasing intake of fruits and vegetables and decreasing saturated fats, white flour products and refined sugars.   American Heart Association guidelines for healthy diet, basically Mediterranean diet, and exercise guidelines of 30 minutes 5 days per week or more discussed in detail.  Health counseling performed.  All questions answered.  4) Lifestyle & Preventative Health Maintenance: - Advised patient to continue working toward  exercising to improve overall mental, physical, and emotional health.    - Reviewed the "spokes of the wheel" of mood and health management.  Stressed the importance of ongoing prudent habits, including regular exercise, appropriate sleep hygiene, healthful dietary habits, and prayer/meditation to relax.  - Encouraged patient to engage in daily physical activity, especially a formal exercise routine.  Recommended that the patient eventually strive for at least 150 minutes of moderate cardiovascular activity per week according to guidelines established by the [employer]   - Healthy dietary habits encouraged, including low-carb, and high amounts of lean protein in diet.   - Patient should also consume adequate amounts of water.   Meds ordered this encounter  Medications  . Zoster Vaccine Adjuvanted [facility]) injection    Sig: Inject 0.5 mLs into the muscle once for 1 dose.    Dispense:  0.5 mL    Refill:  0    Gross side effects, risk and benefits, and alternatives of medications discussed with patient.  Patient is aware that all medications have potential side effects and we are unable to predict every side effect or drug-drug interaction that may occur.  Expresses verbal understanding and consents to current therapy plan and treatment regimen.  Please see AVS handed out to patient at the end of our visit for further patient instructions/ counseling done pertaining to today's office visit.  Follow-up preventative CPE in 1 year. Follow-up office visit pending lab work.  F/up sooner for chronic care management and/or prn  This document serves as a record of services personally performed by [name], DO. It was created  on her behalf by Toni Amend, a trained medical scribe. The creation of this record is based on the scribe's personal observations and the provider's statements to them.   I have reviewed the above medical documentation for accuracy and completeness and I concur.    [name], DO 04/02/2018 3:45 PM        Subjective:    CC: CPE  HPI: Jesus [name] is a 72 y.o. male who presents to [place] at [facility] today for a yearly health maintenance exam.     Health Maintenance Summary Reviewed and updated, unless pt declines services.  Colonoscopy:  Last colonoscopy performed in 2017; had a polyp, showed negative for dysplasia or malignancy. Tobacco History Reviewed:  Never smoker. Alcohol:    No concerns, no excessive use. Exercise Habits:  "I figure God gave me one body, so I'm gonna try to take care of it." STD concerns:  None. Drug Use:  None. Birth control method:  N/a. Testicular/penile concerns:  No concerns about sexual function.  He is still having sex with his wife.  Feels that he's getting up a little more to urinate at night.  "The last month or two, I've been getting up at five in the morning."  He follows up with urology every six months.  Recently (in October) had a kidney stone.  Notes he tries to drink about 5 bottles of water per day.  History of Immunizations Patient notes he cannot remember if he's had the shingles vaccine or not.  History of Hemorrhoids He has has hemorrhoids "a few times in the past."  Visual Health Has regular eye screenings due to his use of glasses and visual aids.  Dental Health He visits the dentist every six months.  Dermatological Health Visits the dermatologist for full skin screenings when requested.  Last week or two, states "something seemed off" regarding his heart.  Today, he denies sweating, denies pain, denies heart flutter.  Notes that he may have been experiencing anxiety.   Immunization History  Administered Date(s) Administered  . Influenza, High Dose Seasonal PF 12/16/2013, 12/12/2015, 01/09/2017  . Influenza-Unspecified 03/12/2011, 01/09/2017  . Pneumococcal Conjugate-13 01/17/2009, 09/16/2013  . Pneumococcal Polysaccharide-23 09/16/2013  . Td  08/17/2008  . Zoster 12/16/2013, 12/12/2015    Health Maintenance  Topic Date Due  . INFLUENZA VACCINE  01/14/2019 (Originally 11/13/2017)  . Hepatitis C Screening  11/26/2028 (Originally 01/03/1946)  . TETANUS/TDAP  08/18/2018  . COLONOSCOPY  06/05/2025  . PNA vac Low Risk Adult  Discontinued      Wt Readings from Last 3 Encounters:  04/02/18 161 lb 4.8 oz (73.2 kg)  01/13/18 159 lb 6.4 oz (72.3 kg)  12/30/17 157 lb 3.2 oz (71.3 kg)   BP Readings from Last 3 Encounters:  04/02/18 112/73  01/13/18 127/69  12/30/17 124/71   Pulse Readings from Last 3 Encounters:  04/02/18 62  01/13/18 (!) 59  12/30/17 73    Patient Active Problem List   Diagnosis Date Noted  . Glucose intolerance (impaired glucose tolerance) 12/12/2016    Priority: High  . CKD (chronic kidney disease), stage III ([place]) 12/12/2016    Priority: High  . Iron deficiency anemia 12/30/2017  . h/o B12 deficiency 12/30/2017  . Muscle spasms- b/l upper traps 09/18/2017  . Tingling in extremities 09/12/2017  . 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 Neuropathy of forearm, right 09/12/2017  . Arthropathy of cervical facet joint 09/12/2017  . Vitamin D deficiency  06/26/2017  . Plantar fasciitis of left foot 03/26/2017  . 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 Elevated PSA 11/28/2016  . Generalized OA 11/26/2016  . BPH (benign prostatic hyperplasia) 11/26/2016  . Environmental and seasonal allergies 11/26/2016  . h/o Inguinal hernia- b/l and abd hernia repaired with mesh 11/26/2016  . S/P colonoscopy 11/26/2016  . h/o Renal stones- ca oxalate.  11/26/2016  . Cervicalgia 11/26/2016  . Peripheral neuropathy 11/26/2016    Past Medical History:  Diagnosis Date  . Arthritis   . BPH (benign prostatic hyperplasia)   . ED (erectile dysfunction)   . Elevated PSA   . History of acute renal failure    08/ 2017  . History of adenomatous polyp of colon    2005 TUBULAR ADENOMA  . History of herpes  genitalis   . History of kidney stones   . Hypogonadism, testicular   . Lower urinary tract symptoms (LUTS)   . Nephrolithiasis    multiple right side non-obstructive per ct 11-27-2015  . Peyronie's disease   . Right ureteral stone     Past Surgical History:  Procedure Laterality Date  . ABDOMINAL HERNIA REPAIR  02/2014  . COLONOSCOPY WITH PROPOFOL N/A 06/06/2015   Procedure: COLONOSCOPY WITH PROPOFOL;  Surgeon: [name], MD;  Location: [facility];  Service: Endoscopy;  Laterality: N/A;  . CYSTOSCOPY WITH RETROGRADE PYELOGRAM, URETEROSCOPY AND STENT PLACEMENT Right 11/30/2015   Procedure: CYSTOSCOPY/RETROGRADE/URETEROSCOPY/;  Surgeon: [name], MD;  [facility];  Service: Urology;  Laterality: Right;  . CYSTOSCOPY/URETEROSCOPY/HOLMIUM LASER/STENT PLACEMENT Right 12/20/2015   Procedure: CYSTOSCOPY/URETEROSCOPY/HOLMIUM LASER/ REMOVE RIGHT JJ STENT;  Surgeon: [name], MD;  Location: [facility];  Service: Urology;  Laterality: Right;  1 HOUR  . CYSTOSCOPY/URETEROSCOPY/HOLMIUM LASER/STENT PLACEMENT Right 01/13/2018   Procedure: CYSTOSCOPY/URETEROSCOPY/HOLMIUM LASER/STONE REMOVAL/STENT PLACEMENT;  Surgeon: [name], MD;  Location: [facility];  Service: Urology;  Laterality: Right;  . INGUINAL HERNIA REPAIR Left 2001  . KNEE ARTHROSCOPY Left 2005  . NESBIT PLICATION FOR PEYRONIE'S DISEASE  05/01/2013   and Right Inguinal Hernia Repair  . TONSILLECTOMY  child    Family History  Problem Relation Age of Onset  . Alcohol abuse Mother   . Cancer Father        Brain  . Cancer Sister        breast  . Hypertension Paternal Grandmother   . Depression Paternal Grandmother        suicide    Social History   Substance and Sexual Activity  Drug Use No  ,  Social History   Substance and Sexual Activity  Alcohol Use Yes  . Alcohol/week: 4.0 standard drinks  . Types: 4 Cans of beer per week  ,  Social History   Tobacco  Use  Smoking Status Never Smoker  Smokeless Tobacco Never Used  ,  Social History   Substance and Sexual Activity  Sexual Activity Yes  . Birth control/protection: None    Patient's Medications  New Prescriptions   ZOSTER VACCINE ADJUVANTED (SHINGRIX) INJECTION    Inject 0.5 mLs into the muscle once for 1 dose.  Previous Medications   B COMPLEX VITAMINS (B COMPLEX PO)    Take by mouth.   CHOLECALCIFEROL (VITAMIN D-3) 5000 UNITS TABS    Take 1 tablet by mouth every morning.   FERROUS SULFATE 325 (65 FE) MG TABLET    Take 325 mg by mouth daily with breakfast.   FEXOFENADINE (ALLEGRA) 180 MG TABLET  Take 180 mg by mouth 3 (three) times a week. Jan- March.    MELOXICAM (MOBIC) 15 MG TABLET    Take 15 mg by mouth daily as needed for pain.   MENAQUINONE-7 (VITAMIN K2) 100 MCG CAPS    Take by mouth.   MULTIPLE VITAMIN (MULTIVITAMIN WITH MINERALS) TABS TABLET    Take 1 tablet by mouth daily.   OMEGA-3 FATTY ACIDS (OMEGA 3 PO)    Take 1 tablet by mouth every morning.   OVER THE COUNTER MEDICATION       PYRIDOXINE (VITAMIN B-6) 100 MG TABLET    Take 100 mg by mouth daily.   TAMSULOSIN (FLOMAX) 0.4 MG CAPS CAPSULE    Take 0.4 mg by mouth every morning.   TESTOSTERONE 10 MG/ACT (2%) GEL    Apply 1 application topically daily.   TURMERIC 450 MG CAPS    Take 1 capsule by mouth daily.   VITAMIN A 09811 UNIT CAPSULE    Take 10,000 Units by mouth daily.  Modified Medications   No medications on file  Discontinued Medications   PHENAZOPYRIDINE (PYRIDIUM) 200 MG TABLET    Take 1 tablet (200 mg total) by mouth 3 (three) times daily as needed for pain.   TRAMADOL (ULTRAM) 50 MG TABLET    Take 1-2 tablets (50-100 mg total) by mouth every 6 (six) hours as needed for moderate pain.    Patient has no known allergies.  Review of Systems: General:   Denies fever, chills, unexplained weight loss.  Optho/Auditory:   Denies visual changes, blurred vision/LOV Respiratory:   Denies SOB, DOE more than  baseline levels.  Cardiovascular:   Denies chest pain, palpitations, new onset peripheral edema  Gastrointestinal:   Denies nausea, vomiting, diarrhea.  Genitourinary: Denies dysuria, freq/ urgency, flank pain or discharge from genitals.  Endocrine:     Denies hot or cold intolerance, polyuria, polydipsia. Musculoskeletal:   Denies unexplained myalgias, joint swelling, unexplained arthralgias, gait problems.  Skin:  Denies rash, suspicious lesions Neurological:     Denies dizziness, unexplained weakness, numbness  Psychiatric/Behavioral:   Denies mood changes, suicidal or homicidal ideations, hallucinations    Objective:     Blood pressure 112/73, pulse 62, temperature 98.1 F (36.7 C), height 5' 9.25" (1.759 m), weight 161 lb 4.8 oz (73.2 kg), SpO2 98 %. Body mass index is 23.65 kg/m. General Appearance:    Alert, cooperative, no distress, appears stated age  Head:    Normocephalic, without obvious abnormality, atraumatic  Eyes:    PERRL, conjunctiva/corneas clear, EOM's intact, fundi    benign, both eyes  Ears:    Normal TM's and external ear canals, both ears  Nose:   Nares normal, septum midline, mucosa normal, no drainage    or sinus tenderness  Throat:   Lips w/o lesion, mucosa moist, and tongue normal; teeth and   gums normal  Neck:   Supple, symmetrical, trachea midline, no adenopathy;    thyroid:  no enlargement/tenderness/nodules; no carotid   bruit or JVD  Back:     Symmetric, no curvature, ROM normal, no CVA tenderness  Lungs:     Clear to auscultation bilaterally, respirations unlabored, no       Wh/ R/ R  Chest Wall:    No tenderness or gross deformity; normal excursion   Heart:    Regular rate and rhythm, S1 and S2 normal, no murmur, rub   or gallop  Abdomen:     Soft, non-tender, bowel sounds active all four  quadrants, NO   G/R/R, no masses, no organomegaly  Genitalia:   Deferred to urology.  Rectal:   Deferred to urology.  Extremities:   Extremities normal,  atraumatic, no cyanosis or gross edema  Pulses:   2+ and symmetric all extremities  Skin:   Warm, dry, Skin color, texture, turgor normal, no obvious rashes or lesions  M-Sk:   Ambulates * 4 w/o difficulty, no gross deformities, tone WNL  Neurologic:   CNII-XII intact, normal strength, sensation and reflexes    Throughout Psych:  No HI/SI, judgement and insight good, Euthymic mood. Full Affect.

## 2018-06-11 DIAGNOSIS — E291 Testicular hypofunction: Secondary | ICD-10-CM | POA: Diagnosis not present

## 2018-06-16 DIAGNOSIS — S46011A Strain of muscle(s) and tendon(s) of the rotator cuff of right shoulder, initial encounter: Secondary | ICD-10-CM | POA: Diagnosis not present

## 2018-06-16 DIAGNOSIS — M19012 Primary osteoarthritis, left shoulder: Secondary | ICD-10-CM | POA: Diagnosis not present

## 2018-06-16 DIAGNOSIS — M7062 Trochanteric bursitis, left hip: Secondary | ICD-10-CM | POA: Diagnosis not present

## 2018-06-16 DIAGNOSIS — M170 Bilateral primary osteoarthritis of knee: Secondary | ICD-10-CM | POA: Diagnosis not present

## 2018-08-06 DIAGNOSIS — E291 Testicular hypofunction: Secondary | ICD-10-CM | POA: Diagnosis not present

## 2018-08-26 DIAGNOSIS — E291 Testicular hypofunction: Secondary | ICD-10-CM | POA: Diagnosis not present

## 2018-08-26 DIAGNOSIS — N202 Calculus of kidney with calculus of ureter: Secondary | ICD-10-CM | POA: Diagnosis not present

## 2018-09-21 ENCOUNTER — Other Ambulatory Visit: Payer: Self-pay | Admitting: Family Medicine

## 2018-09-22 NOTE — Telephone Encounter (Signed)
Last filled by a pervious provider.  LOV 04/02/2018 please review and advise. MPulliam, CMA/RT(R)

## 2018-09-29 ENCOUNTER — Ambulatory Visit: Payer: Federal, State, Local not specified - PPO | Admitting: Family Medicine

## 2018-09-29 ENCOUNTER — Other Ambulatory Visit: Payer: Self-pay

## 2018-09-29 DIAGNOSIS — Z5329 Procedure and treatment not carried out because of patient's decision for other reasons: Secondary | ICD-10-CM

## 2018-09-29 DIAGNOSIS — Z91199 Patient's noncompliance with other medical treatment and regimen due to unspecified reason: Secondary | ICD-10-CM

## 2018-09-29 NOTE — Progress Notes (Signed)
Patient requested r/s for next week. MPulliam, CMA/RT(R)

## 2018-10-06 ENCOUNTER — Other Ambulatory Visit: Payer: Self-pay

## 2018-10-06 ENCOUNTER — Ambulatory Visit (INDEPENDENT_AMBULATORY_CARE_PROVIDER_SITE_OTHER): Payer: Federal, State, Local not specified - PPO | Admitting: Family Medicine

## 2018-10-06 ENCOUNTER — Encounter: Payer: Self-pay | Admitting: Family Medicine

## 2018-10-06 VITALS — BP 117/71 | HR 58 | Temp 98.3°F | Ht 69.25 in | Wt 162.4 lb

## 2018-10-06 DIAGNOSIS — M76899 Other specified enthesopathies of unspecified lower limb, excluding foot: Secondary | ICD-10-CM | POA: Diagnosis not present

## 2018-10-06 DIAGNOSIS — K4090 Unilateral inguinal hernia, without obstruction or gangrene, not specified as recurrent: Secondary | ICD-10-CM

## 2018-10-06 DIAGNOSIS — S30861A Insect bite (nonvenomous) of abdominal wall, initial encounter: Secondary | ICD-10-CM

## 2018-10-06 DIAGNOSIS — H6122 Impacted cerumen, left ear: Secondary | ICD-10-CM

## 2018-10-06 DIAGNOSIS — H9192 Unspecified hearing loss, left ear: Secondary | ICD-10-CM

## 2018-10-06 DIAGNOSIS — R5383 Other fatigue: Secondary | ICD-10-CM

## 2018-10-06 DIAGNOSIS — Z8719 Personal history of other diseases of the digestive system: Secondary | ICD-10-CM

## 2018-10-06 NOTE — Progress Notes (Signed)
Impression and Recommendations:    1. Generalized OA   2. Knee tendinitis   3. Inguinal hernia without obstruction or gangrene, recurrence not specified, unspecified laterality   4. Decreased hearing, left   5. Impacted cerumen of left ear   6. History of chronic constipation   7. Tick bite of abdomen, initial encounter-  several months ago   8. Fatigue, unspecified type    Knee tendinitis - Plan: care per Dr Noemi Chapel  Inguinal hernia without obstruction or gangrene, recurrence not specified, unspecified laterality - Plan:  No hernia appreciated, reassured pt  Decreased hearing, left - Plan: due to cerumen impaction- cleared once canals cleared  Indication: Cerumen impaction of the ear(s) Medical necessity statement:  On physical examination, cerumen impairs clinically significant portions of the external auditory canal, and tympanic membrane.  Noted obstructive, copious cerumen that cannot be removed without magnification and instrumentations requiring physician skills Consent:  Discussed benefits and risks of procedure and verbal consent obtained Procedure:   Patient was prepped for the procedure.  Utilized an otoscope to assess and take note of the ear canal, the tympanic membrane, and the presence, amount, and placement of the cerumen. Gentle water irrigation and soft plastic curette was utilized to remove cerumen. Post procedure examination:  shows cerumen was removed, without trauma or injury to the ear canal or TM, which remains intact.   Post-Procedural Ear Care Instructions:    Patient tolerated procedure well.  Proper ear care d/c pt.   The patient is made aware that they may experience temporary vertigo, temporary hearing loss, and temporary discomfort.  If these symptom last for more than 24 hours to call the clinic or proceed to the ED/Urgent Care.  Impacted cerumen of left ear - Plan: ear care d/c pt; no Q-tips  History of chronic constipation - Plan: importance of  adequate fluid intake  Tick bite of abdomen, initial encounter-  several months ago - without SX - Plan: s/sx of lymes and others d/c pt; told to let me know if he dev any of these  Fatigue, unspecified type - Plan: d/c pt stress can cause one to feel tired etc, will obtain labs near future/ next OV  Pt was interviewed and evaluated by me in the clinic today for 32.5+ minutes, with over 50% time spent in face to face counseling of patients various medical conditions, treatment plans of those medical conditions including medicine management and lifestyle modification, strategies to improve health and well being; and in coordination of care. SEE ABOVE TREATMENT PLAN FOR DETAILS  Gross side effects, risk and benefits, and alternatives of medications and treatment plan in general discussed with patient.  Patient is aware that all medications have potential side effects and we are unable to predict every side effect or drug-drug interaction that may occur.   Patient will call with any questions prior to using medication if they have concerns.    Expresses verbal understanding and consents to current therapy and treatment regimen.  No barriers to understanding were identified.  Red flag symptoms and signs discussed in detail.  Patient expressed understanding regarding what to do in case of emergency\urgent symptoms  Please see AVS handed out to patient at the end of our visit for further patient instructions/ counseling done pertaining to today's office visit.   Return for Follow-up 6 months for Medicare wellness, 4 months for chronic problem follow-up and ear check.     Note:  This note was prepared  with assistance of Systems analyst. Occasional wrong-word or sound-a-like substitutions may have occurred due to the inherent limitations of voice recognition software.    --------------------------------------------------------------------------------------------------------------------------------------------------------------------------------------------------------------------------------------  Subjective:     HPI: Jesus [name] is a 73 y.o. male who presents to [name] at [facility] today for issues as discussed below.  ---> Takes meloxicam for knee pain, tendonitis - [name].  No s-e, no Gi bleed, no reflux  ---> L ear hearing decreased- no q-tips. No recent swimming. No pain, feels full at time though.   ---> tick bite in March, healing well -  no sx at all.   R lower ant abd was where it bit; no sx at all now   --> BM more regular. He is happy with this     --> concerns with energy level- more tired than usual  - "feels lithless and tired"  Pt feels tightness in R groin area- started couple months ago. No protuberance.   Inguinal hernia repair b/l in remote past- 73yrs ago or so, periumbilical hernia repair 3-4 yrs ago.    Wt Readings from Last 3 Encounters:  10/06/18 162 lb 6.4 oz (73.7 kg)  04/02/18 161 lb 4.8 oz (73.2 kg)  01/13/18 159 lb 6.4 oz (72.3 kg)   BP Readings from Last 3 Encounters:  10/06/18 117/71  04/02/18 112/73  01/13/18 127/69   Pulse Readings from Last 3 Encounters:  10/06/18 (!) 58  04/02/18 62  01/13/18 (!) 59   BMI Readings from Last 3 Encounters:  10/06/18 23.81 kg/m  04/02/18 23.65 kg/m  01/13/18 23.54 kg/m     Patient Care Team    Relationship Specialty Notifications Start End  [name], DO PCP - General Family Medicine  11/26/16   Specialists, [place]  Orthopedic Surgery  11/26/16   Druscilla Brownie, MD Consulting Physician Dermatology  11/26/16   [name], MD Consulting Physician Urology  11/26/16   [name], MD Consulting Physician Gastroenterology  11/26/16      Patient Active Problem List   Diagnosis Date Noted  . Glucose  intolerance (impaired glucose tolerance) 12/12/2016    Priority: High  . CKD (chronic kidney disease), stage III ([place]) 12/12/2016    Priority: High  . Decreased hearing, left 11/20/2018  . History of chronic constipation 11/20/2018  . Iron deficiency anemia 12/30/2017  . h/o B12 deficiency 12/30/2017  . Muscle spasms- b/l upper traps 09/18/2017  . Tingling in extremities 09/12/2017  . 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 Neuropathy of forearm, right 09/12/2017  . Arthropathy of cervical facet joint 09/12/2017  . Vitamin D deficiency 06/26/2017  . Plantar fasciitis of left foot 03/26/2017  . 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 Elevated PSA 11/28/2016  . Generalized OA 11/26/2016  . BPH (benign prostatic hyperplasia) 11/26/2016  . Environmental and seasonal allergies 11/26/2016  . h/o Inguinal hernia- b/l and abd hernia repaired with mesh 11/26/2016  . S/P colonoscopy 11/26/2016  . h/o Renal stones- ca oxalate.  11/26/2016  . Cervicalgia 11/26/2016  . Peripheral neuropathy 11/26/2016    Past Medical history, Surgical history, Family history, Social history, Allergies and Medications have been entered into the medical record, reviewed and changed as needed.    Current Meds  Medication Sig  . B Complex Vitamins (B COMPLEX PO) Take by mouth.  . Cholecalciferol (VITAMIN D-3) 5000 units TABS Take 1 tablet by mouth every morning.  . ferrous sulfate 325 (65 FE) MG  tablet Take 325 mg by mouth daily with breakfast.  . fexofenadine (ALLEGRA) 180 MG tablet Take 180 mg by mouth 3 (three) times a week. Jan- March.   . meloxicam (MOBIC) 15 MG tablet 05-1tab prn daily for moderate/severe pain with food  . Menaquinone-7 (VITAMIN K2) 100 MCG CAPS Take by mouth.  . Multiple Vitamin (MULTIVITAMIN WITH MINERALS) TABS tablet Take 1 tablet by mouth daily.  . Omega-3 Fatty Acids (OMEGA 3 PO) Take 1 tablet by mouth every morning.  [name] OVER THE COUNTER MEDICATION   . pyridOXINE  (VITAMIN B-6) 100 MG tablet Take 100 mg by mouth daily.  . tamsulosin (FLOMAX) 0.4 MG CAPS capsule Take 0.4 mg by mouth every morning.  . Testosterone 10 MG/ACT (2%) GEL Apply 1 application topically daily.  . Turmeric 450 MG CAPS Take 1 capsule by mouth daily.  . vitamin A 10000 UNIT capsule Take 10,000 Units by mouth daily.    Allergies:  No Known Allergies   Review of Systems:  A fourteen system review of systems was performed and found to be positive as per HPI.   Objective:   Blood pressure 117/71, pulse (!) 58, temperature 98.3 F (36.8 C), height 5' 9.25" (1.759 m), weight 162 lb 6.4 oz (73.7 kg), SpO2 (!) 58 %. Body mass index is 23.81 kg/m. General:  Well Developed, well nourished, appropriate for stated age.  Ambulates w/o diff.  Neuro:  Alert and oriented,  extra-ocular muscles intact  HEENT:  Normocephalic, atraumatic, neck supple, bilateral ear wax/cerumen impaction and unable to see TMs. Manual extraction cerumen b/l w/o incident, chronic TM changes once removed Skin:  no gross rash, warm, pink. Cardiac:  RRR, S1 S2 Respiratory:  ECTA B/L and A/P, Not using accessory muscles, speaking in full sentences- unlabored. Abdominal exam: Positive bowel sounds x4, no guarding rigidity or rebound.  No abdominal protuberances.  1 small 3-minute centimeter soft tissue mass which is freely mobile in his left lower quadrant appreciated.  No inguinal hernias.  Testes bilateral are within normal limits.  No penile rashes or abnormalities appreciated Vascular:  Ext warm, no cyanosis apprec.; cap RF less 2 sec. Psych:  No HI/SI, judgement and insight good, Euthymic mood. Full Affect.

## 2018-10-19 DIAGNOSIS — H2513 Age-related nuclear cataract, bilateral: Secondary | ICD-10-CM | POA: Diagnosis not present

## 2018-10-19 DIAGNOSIS — H43392 Other vitreous opacities, left eye: Secondary | ICD-10-CM | POA: Diagnosis not present

## 2018-10-19 DIAGNOSIS — H25011 Cortical age-related cataract, right eye: Secondary | ICD-10-CM | POA: Diagnosis not present

## 2018-11-20 DIAGNOSIS — H9192 Unspecified hearing loss, left ear: Secondary | ICD-10-CM | POA: Insufficient documentation

## 2018-11-20 DIAGNOSIS — Z8719 Personal history of other diseases of the digestive system: Secondary | ICD-10-CM | POA: Insufficient documentation

## 2018-12-15 DIAGNOSIS — H2511 Age-related nuclear cataract, right eye: Secondary | ICD-10-CM | POA: Diagnosis not present

## 2018-12-15 DIAGNOSIS — H25043 Posterior subcapsular polar age-related cataract, bilateral: Secondary | ICD-10-CM | POA: Diagnosis not present

## 2018-12-15 DIAGNOSIS — H18413 Arcus senilis, bilateral: Secondary | ICD-10-CM | POA: Diagnosis not present

## 2018-12-15 DIAGNOSIS — H25013 Cortical age-related cataract, bilateral: Secondary | ICD-10-CM | POA: Diagnosis not present

## 2018-12-15 DIAGNOSIS — H2513 Age-related nuclear cataract, bilateral: Secondary | ICD-10-CM | POA: Diagnosis not present

## 2018-12-16 ENCOUNTER — Other Ambulatory Visit: Payer: Self-pay | Admitting: Family Medicine

## 2018-12-16 NOTE — Telephone Encounter (Signed)
Does this medication need to come from Ortho now?  Please advise.  Charyl Bigger, CMA

## 2018-12-16 NOTE — Telephone Encounter (Signed)
Yes thank you!   if he is seeing orthopedics for his joint pains, and the joint pain medicine needs to be administered by the treating physician and care team

## 2019-02-04 ENCOUNTER — Other Ambulatory Visit: Payer: Self-pay

## 2019-02-04 ENCOUNTER — Ambulatory Visit (INDEPENDENT_AMBULATORY_CARE_PROVIDER_SITE_OTHER): Payer: Federal, State, Local not specified - PPO | Admitting: Family Medicine

## 2019-02-04 ENCOUNTER — Encounter: Payer: Self-pay | Admitting: Family Medicine

## 2019-02-04 VITALS — BP 105/64 | HR 67 | Temp 98.1°F | Resp 16 | Ht 70.0 in | Wt 157.4 lb

## 2019-02-04 DIAGNOSIS — H6120 Impacted cerumen, unspecified ear: Secondary | ICD-10-CM | POA: Insufficient documentation

## 2019-02-04 DIAGNOSIS — M1710 Unilateral primary osteoarthritis, unspecified knee: Secondary | ICD-10-CM

## 2019-02-04 DIAGNOSIS — H9190 Unspecified hearing loss, unspecified ear: Secondary | ICD-10-CM

## 2019-02-04 DIAGNOSIS — M179 Osteoarthritis of knee, unspecified: Secondary | ICD-10-CM | POA: Insufficient documentation

## 2019-02-04 NOTE — Progress Notes (Signed)
Impression and Recommendations:    1. Osteoarthritis of knee, unspecified laterality, unspecified osteoarthritis type   2. Impacted cerumen, unspecified laterality   3. Decreased hearing, unspecified laterality      - Last OV pt told to return in 4 months for chronic problems, 6 months for Wellness/CPE.  - CMA checked vaccine record and states patient is up to date on both pneumonias as well as had his shingles series.  Osteoarthritis of Knee, Unspecified Laterality - Stable at this time on current cortisone injection management. - Patient will continue to follow up with [name] for cortisone injections as established. - Encouraged patient to look into physical therapy and further assistance PRN. - Will continue to monitor.  Impacted Cerumen, Decreased Hearing, Unspecified Laterality - Stable at this time.  Indication: Cerumen impaction of the ear(s)  Medical necessity statement:  On physical examination, cerumen impairs clinically significant portions of the external auditory canal, and tympanic membrane.  Noted obstructive, copious cerumen that cannot be removed without magnification and instrumentations requiring physician skills Consent:  Discussed benefits and risks of procedure and verbal consent obtained Procedure:   Patient was prepped for the procedure.  Utilized an otoscope to assess and take note of the ear canal, the tympanic membrane, and the presence, amount, and placement of the cerumen.  Soft plastic curette was utilized by physician to remove cerumen. Post procedure examination:  shows cerumen was removed, without trauma or injury to the ear canal or TM, which remains intact.   Post-Procedural Ear Care Instructions:    Patient tolerated procedure well.  Proper ear care d/c pt.   The patient is made aware that they may experience temporary vertigo, temporary hearing loss, and temporary discomfort.  If these symptom last for more than 24 hours to call the  clinic or proceed to the ED/Urgent Care.  - Continue to avoid use of Q-tips in the ears. - To clean the ears, patient may use a solution of half hydrogen peroxide, half rubbing alcohol. - Advised patient to clean his ears twice weekly, leaving the solution in each ear for 15-20 minutes. - Will continue to monitor and follow up every six months.  Lifestyle & Preventative Health Maintenance - Advised patient to continue working toward exercising to improve overall mental, physical, and emotional health.    - Reviewed the "spokes of the wheel" of mood and health management.  Stressed the importance of ongoing prudent habits, including regular exercise, appropriate sleep hygiene, healthful dietary habits, and prayer/meditation to relax.  - Encouraged patient to engage in daily physical activity as tolerated, especially a formal exercise routine.  Recommended that the patient eventually strive for at least 150 minutes of moderate cardiovascular activity per week according to guidelines established by the [facility].   - Healthy dietary habits encouraged, including low-carb, and high amounts of lean protein in diet.   - Patient should also consume adequate amounts of water.  - Health counseling performed.  All questions answered.  Recommendations - Per patient, got his flu shot at [facility] in September. - Return for Wellness/CPE and full fasting lab work as advised. - Reviewed need for UTD vaccinations with patient at next Wellness visit.   Medications Discontinued During This Encounter  Medication Reason  . meloxicam (MOBIC) 15 MG tablet Discontinued by provider  . pyridOXINE (VITAMIN B-6) 100 MG tablet Patient Preference     Gross side effects, risk and benefits, and alternatives of medications and treatment plan in general discussed with patient.  Patient is aware that all medications have potential side effects and we are unable to predict every side effect or drug-drug interaction that may occur.    Patient will call with any questions prior to using medication if they have concerns.    Expresses verbal understanding and consents to current therapy and treatment regimen.  No barriers to understanding were identified.  Red flag symptoms and signs discussed in detail.  Patient expressed understanding regarding what to do in case of emergency\urgent symptoms  Please see AVS handed out to patient at the end of our visit for further patient instructions/ counseling done pertaining to today's office visit.   Return for CPE around 04/03/19, will need FBW same day.     Note:  This note was prepared with assistance of Dragon voice recognition software. Occasional wrong-word or sound-a-like substitutions may have occurred due to the inherent limitations of voice recognition software.   This document serves as a record of services personally performed by [name], DO. It was created on her behalf by Toni Amend, a trained medical scribe. The creation of this record is based on the scribe's personal observations and the provider's statements to them.   This case required medical decision making of at least moderate complexity. The above documentation has been reviewed to be accurate and was completed by Marjory Sneddon, D.O.      --------------------------------------------------------------------------------------------------------------------------------------------------------------------------------------------------------------------------------------------    Subjective:     HPI: BRIGHT KILLE is a 73 y.o. male who presents to [place] at [facility] today for issues as discussed below.  Got his flu shot at [facility] in September.  - Ears Notes "we did an ear wash last time, and you wanted to see how they were doing in four months."  Says his ears feel pretty good.  He has not been using Q-tips in his ears; "haven't done anything, just showers."  -  Osteoarthritis in the Knees Notes he has not been doing physical therapy, but has continued receiving coritsone injections.  In March, had cortisone injection; notes "years ago when we first started, it used to last pretty long, but now it doesn't last very long anymore."  He follows up with [facility], and was told he can receive shots three times per year.  Says he is about ready to go back.  - Physical Activity He still golfs; is taking a strength and endurance class on Tuesday/Thursday, and does yoga.  Says they are not wearing masks, but he is socially distancing "and we haven't had any problems."  - Mood/Stress When asked how his stress is lately, says "everybody has some, but I think it's okay."  Denies dizziness, heart palpitations, headache at home.  Nothing new or concerning.  Denies constipation or GI concerns; "doing pretty well."   Wt Readings from Last 3 Encounters:  02/04/19 157 lb 6.4 oz (71.4 kg)  10/06/18 162 lb 6.4 oz (73.7 kg)  04/02/18 161 lb 4.8 oz (73.2 kg)   BP Readings from Last 3 Encounters:  02/04/19 105/64  10/06/18 117/71  04/02/18 112/73   Pulse Readings from Last 3 Encounters:  02/04/19 67  10/06/18 (!) 58  04/02/18 62   BMI Readings from Last 3 Encounters:  02/04/19 22.58 kg/m  10/06/18 23.81 kg/m  04/02/18 23.65 kg/m     Patient Care Team    Relationship Specialty Notifications Start End  [name], DO PCP - General Family Medicine  11/26/16   Specialists, [name] Orthopedic  Orthopedic Surgery  11/26/16   Druscilla Brownie, MD Consulting Physician Dermatology  11/26/16   [name], MD Consulting Physician Urology  11/26/16   [name], MD Consulting Physician Gastroenterology  11/26/16      Patient Active Problem List   Diagnosis Date Noted  . Glucose intolerance (impaired glucose tolerance) 12/12/2016    Priority: High  . CKD (chronic kidney disease), stage III 12/12/2016    Priority: High  .  Osteoarthritis of knee 02/04/2019  . Impacted cerumen 02/04/2019  . Decreased hearing, left 11/20/2018  . History of chronic constipation 11/20/2018  . Iron deficiency anemia 12/30/2017  . h/o B12 deficiency 12/30/2017  . Muscle spasms- b/l upper traps 09/18/2017  . Tingling in extremities 09/12/2017  . 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 Neuropathy of forearm, right 09/12/2017  . Arthropathy of cervical facet joint 09/12/2017  . Vitamin D deficiency 06/26/2017  . Plantar fasciitis of left foot 03/26/2017  . 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 Elevated PSA 11/28/2016  . Generalized OA 11/26/2016  . BPH (benign prostatic hyperplasia) 11/26/2016  . Environmental and seasonal allergies 11/26/2016  . h/o Inguinal hernia- b/l and abd hernia repaired with mesh 11/26/2016  . S/P colonoscopy 11/26/2016  . h/o Renal stones- ca oxalate.  11/26/2016  . Cervicalgia 11/26/2016  . Peripheral neuropathy 11/26/2016    Past Medical history, Surgical history, Family history, Social history, Allergies and Medications have been entered into the medical record, reviewed and changed as needed.    Current Meds  Medication Sig  . B Complex Vitamins (B COMPLEX PO) Take by mouth.  . Cholecalciferol (VITAMIN D-3) 5000 units TABS Take 1 tablet by mouth every morning.  [name] co-enzyme Q-10 30 MG capsule Take 30 mg by mouth 3 (three) times daily.  . ferrous sulfate 325 (65 FE) MG tablet Take 325 mg by mouth daily with breakfast.  . fexofenadine (ALLEGRA) 180 MG tablet Take 180 mg by mouth 3 (three) times a week. Jan- March.   . Menaquinone-7 (VITAMIN K2) 100 MCG CAPS Take by mouth.  . Multiple Vitamin (MULTIVITAMIN WITH MINERALS) TABS tablet Take 1 tablet by mouth daily.  . Omega-3 Fatty Acids (OMEGA 3 PO) Take 1 tablet by mouth every morning.  [name] OVER THE COUNTER MEDICATION   . tamsulosin (FLOMAX) 0.4 MG CAPS capsule Take 0.4 mg by mouth every morning.  . Testosterone 10 MG/ACT (2%) GEL  Apply 1 application topically daily.  . Turmeric 450 MG CAPS Take 1 capsule by mouth daily.  . vitamin A 10000 UNIT capsule Take 10,000 Units by mouth daily.  . vitamin B-12 (CYANOCOBALAMIN) 100 MCG tablet Take 100 mcg by mouth daily.  . [DISCONTINUED] pyridOXINE (VITAMIN B-6) 100 MG tablet Take 100 mg by mouth daily.    Allergies:  No Known Allergies   Review of Systems:  A fourteen system review of systems was performed and found to be positive as per HPI.   Objective:   Blood pressure 105/64, pulse 67, temperature 98.1 F (36.7 C), resp. rate 16, height 5\' 10"  (1.778 m), weight 157 lb 6.4 oz (71.4 kg), SpO2 96 %. Body mass index is 22.58 kg/m. General:  Well Developed, well nourished, appropriate for stated age.  Neuro:  Alert and oriented,  extra-ocular muscles intact  HEENT:  Normocephalic, atraumatic, neck supple, no carotid bruits appreciated  Skin:  no gross rash, warm, pink. Cardiac:  RRR, S1 S2 Respiratory:  ECTA B/L and A/P, Not using accessory muscles, speaking in full sentences- unlabored. Vascular:  Ext warm, no cyanosis apprec.; cap RF less 2 sec. Psych:  No HI/SI, judgement and insight good, Euthymic mood. Full Affect.

## 2019-02-04 NOTE — Patient Instructions (Addendum)
To clean your years, please use a solution of half hydrogen peroxide, half rubbing alcohol. You may use this twice weekly.  Keep the solution in each ear for 15-20 minutes.    Earwax Buildup, Adult The ears produce a substance called earwax that helps keep bacteria out of the ear and protects the skin in the ear canal. Occasionally, earwax can build up in the ear and cause discomfort or hearing loss. What increases the risk? This condition is more likely to develop in people who:  Are male.  Are elderly.  Naturally produce more earwax.  Clean their ears often with cotton swabs.  Use earplugs often.  Use in-ear headphones often.  Wear hearing aids.  Have narrow ear canals.  Have earwax that is overly thick or sticky.  Have eczema.  Are dehydrated.  Have excess hair in the ear canal. What are the signs or symptoms? Symptoms of this condition include:  Reduced or muffled hearing.  A feeling of fullness in the ear or feeling that the ear is plugged.  Fluid coming from the ear.  Ear pain.  Ear itch.  Ringing in the ear.  Coughing.  An obvious piece of earwax that can be seen inside the ear canal. How is this diagnosed? This condition may be diagnosed based on:  Your symptoms.  Your medical history.  An ear exam. During the exam, your health care provider will look into your ear with an instrument called an otoscope. You may have tests, including a hearing test. How is this treated? This condition may be treated by:  Using ear drops to soften the earwax.  Having the earwax removed by a health care provider. The health care provider may: ? Flush the ear with water. ? Use an instrument that has a loop on the end (curette). ? Use a suction device.  Surgery to remove the wax buildup. This may be done in severe cases. Follow these instructions at home:   Take over-the-counter and prescription medicines only as told by your health care provider.  Do  not put any objects, including cotton swabs, into your ear. You can clean the opening of your ear canal with a washcloth or facial tissue.  Follow instructions from your health care provider about cleaning your ears. Do not over-clean your ears.  Drink enough fluid to keep your urine clear or pale yellow. This will help to thin the earwax.  Keep all follow-up visits as told by your health care provider. If earwax builds up in your ears often or if you use hearing aids, consider seeing your health care provider for routine, preventive ear cleanings. Ask your health care provider how often you should schedule your cleanings.  If you have hearing aids, clean them according to instructions from the manufacturer and your health care provider. Contact a health care provider if:  You have ear pain.  You develop a fever.  You have blood, pus, or other fluid coming from your ear.  You have hearing loss.  You have ringing in your ears that does not go away.  Your symptoms do not improve with treatment.  You feel like the room is spinning (vertigo). Summary  Earwax can build up in the ear and cause discomfort or hearing loss.  The most common symptoms of this condition include reduced or muffled hearing and a feeling of fullness in the ear or feeling that the ear is plugged.  This condition may be diagnosed based on your symptoms, your medical history,  and an ear exam.  This condition may be treated by using ear drops to soften the earwax or by having the earwax removed by a health care provider.  Do not put any objects, including cotton swabs, into your ear. You can clean the opening of your ear canal with a washcloth or facial tissue. This information is not intended to replace advice given to you by your health care provider. Make sure you discuss any questions you have with your health care provider. Document Released: 05/09/2004 Document Revised: 03/14/2017 Document Reviewed:  06/12/2016 Elsevier Patient Education  2020 [employer].

## 2019-02-08 DIAGNOSIS — H25011 Cortical age-related cataract, right eye: Secondary | ICD-10-CM | POA: Diagnosis not present

## 2019-02-08 DIAGNOSIS — H2511 Age-related nuclear cataract, right eye: Secondary | ICD-10-CM | POA: Diagnosis not present

## 2019-02-08 DIAGNOSIS — H52201 Unspecified astigmatism, right eye: Secondary | ICD-10-CM | POA: Diagnosis not present

## 2019-02-09 DIAGNOSIS — H25042 Posterior subcapsular polar age-related cataract, left eye: Secondary | ICD-10-CM | POA: Diagnosis not present

## 2019-02-09 DIAGNOSIS — H25012 Cortical age-related cataract, left eye: Secondary | ICD-10-CM | POA: Diagnosis not present

## 2019-02-09 DIAGNOSIS — H2512 Age-related nuclear cataract, left eye: Secondary | ICD-10-CM | POA: Diagnosis not present

## 2019-03-01 DIAGNOSIS — H25011 Cortical age-related cataract, right eye: Secondary | ICD-10-CM | POA: Diagnosis not present

## 2019-03-01 DIAGNOSIS — H25012 Cortical age-related cataract, left eye: Secondary | ICD-10-CM | POA: Diagnosis not present

## 2019-03-01 DIAGNOSIS — H52202 Unspecified astigmatism, left eye: Secondary | ICD-10-CM | POA: Diagnosis not present

## 2019-03-01 DIAGNOSIS — H2512 Age-related nuclear cataract, left eye: Secondary | ICD-10-CM | POA: Diagnosis not present

## 2019-04-12 ENCOUNTER — Other Ambulatory Visit: Payer: Self-pay

## 2019-04-12 ENCOUNTER — Encounter: Payer: Self-pay | Admitting: Family Medicine

## 2019-04-12 ENCOUNTER — Ambulatory Visit (INDEPENDENT_AMBULATORY_CARE_PROVIDER_SITE_OTHER): Payer: Federal, State, Local not specified - PPO | Admitting: Family Medicine

## 2019-04-12 VITALS — BP 130/73 | HR 59 | Temp 97.8°F | Resp 12 | Ht 69.0 in | Wt 154.5 lb

## 2019-04-12 DIAGNOSIS — Z0000 Encounter for general adult medical examination without abnormal findings: Secondary | ICD-10-CM | POA: Diagnosis not present

## 2019-04-12 DIAGNOSIS — Z23 Encounter for immunization: Secondary | ICD-10-CM

## 2019-04-12 DIAGNOSIS — R972 Elevated prostate specific antigen [PSA]: Secondary | ICD-10-CM

## 2019-04-12 DIAGNOSIS — R7302 Impaired glucose tolerance (oral): Secondary | ICD-10-CM

## 2019-04-12 DIAGNOSIS — E559 Vitamin D deficiency, unspecified: Secondary | ICD-10-CM

## 2019-04-12 DIAGNOSIS — N183 Chronic kidney disease, stage 3 unspecified: Secondary | ICD-10-CM

## 2019-04-12 DIAGNOSIS — D509 Iron deficiency anemia, unspecified: Secondary | ICD-10-CM

## 2019-04-12 NOTE — Progress Notes (Signed)
Male physical  Impression and Recommendations:    1. Encounter for wellness examination   2. Need for Tdap vaccination   3. Glucose intolerance (impaired glucose tolerance)   4. Stage 3 chronic kidney disease, unspecified whether stage 3a or 3b CKD   5. Elevated PSA   6. Vitamin D deficiency   7. Iron deficiency anemia, unspecified iron deficiency anemia type   8. Need for shingles vaccine     1) Anticipatory Guidance: Discussed importance of wearing a seatbelt while driving, not texting while driving;   sunscreen when outside along with skin surveillance; eating a balanced and modest diet; physical activity at least 25 minutes per day or 150 min/ week moderate to intense activity.  -  Prudent skin screening habits extensively reviewed with patient today.  A,B,C,D's of skin surveillance discussed at length.  2) Immunizations / Screenings / Labs:   All immunizations are up-to-date per recommendations or will be updated today. Patient is due for dental and vision screens which pt will schedule independently. Will obtain CBC, CMP, HgA1c, Lipid panel, TSH and vit D when fasting, if not already done recently.   - Last colonoscopy obtained in 2017; negative, benign polyp removed, no need to return.  - Need for updated TDAP.  - Discussed need for shingles vaccine with patient today.  - Pneumonia vaccinations up to date.  - Discussed low-risk Hep C/HIV screening with patient today.  Patient gives blood regularly and is screened often.  - Flu vaccination up to date.  3) Health Counseling & Preventative Maintenance   - Discussed goal of maintaining BMI WNL, and improving nutrient density of diet through increasing intake of fruits and vegetables and decreasing saturated fats, white flour products and refined sugars.   - Advised patient to continue working toward exercising to improve physical conditioning, and overall mental, physical, and emotional health.    - Reviewed the  "spokes of the wheel" of mood and health management.  Stressed the importance of ongoing prudent habits, including regular exercise, appropriate sleep hygiene, healthful dietary habits, and prayer/meditation to relax.  - Encouraged patient to engage in daily physical activity, especially a formal exercise routine.  Recommended that the patient eventually strive for at least 150 minutes of moderate cardiovascular activity per week according to guidelines established by the [facility].   - Healthy dietary habits encouraged, including low-carb, and high amounts of lean protein in diet.   - Patient should also consume adequate amounts of water.  - Health counseling performed.  All questions answered.  4) COVID-19 Counseling - Novel Covid -19 counseling done; all questions were answered.   - Current CDC / federal and [place] guidelines reviewed with patient  - Reminded pt of extreme importance of social distancing; wearing a mask when out in public; insensate handwashing and cleaning of surfaces, avoiding unnecessary trips for shopping and avoiding ALL but emergency appts etc. - Told patient to be prepared, not scared; and be smart for the sake of others - Patient will call with any additional concerns    Orders Placed This Encounter  Procedures  . Tdap vaccine greater than or equal to 7yo IM  . Varicella-zoster vaccine IM (Shingrix)  . CBC w/Diff  . CMP (comprehensive metabolic panel)    Order Specific Question:   Has the patient fasted?    Answer:   No  . TSH  . Lipid panel    Order Specific Question:   Has the patient fasted?  Answer:   No  . VITAMIN D 25 Hydroxy (Vit-D Deficiency, Fractures)  . Hemoglobin A1c  . T4, free    No orders of the defined types were placed in this encounter.    Return for f/up 4-6 months for routine care.    Gross side effects, risk and benefits, and alternatives of medications discussed with patient.  Patient is aware that all medications have  potential side effects and we are unable to predict every side effect or drug-drug interaction that may occur.  Expresses verbal understanding and consents to current therapy plan and treatment regimen.  Please see AVS handed out to patient at the end of our visit for further patient instructions/ counseling done pertaining to today's office visit.  Follow-up preventative CPE in 1 year. Follow-up office visit - may be needed pending lab work.   - F/up sooner for chronic care management as above and/or prn  This document serves as a record of services personally performed by [name], DO. It was created on her behalf by Toni Amend, a trained medical scribe. The creation of this record is based on the scribe's personal observations and the provider's statements to them.   This case required medical decision making of at least moderate complexity. The above documentation has been reviewed to be accurate and was completed by Marjory Sneddon, D.O.      Subjective:   I, Toni Amend, am serving as scribe for Dr. [name].  CC: CPE  HPI: Jesus [name] is a 73 y.o. male who presents to [facility] today for a yearly health maintenance exam.     Health Maintenance Summary Reviewed and updated, unless pt declines services.  Colonoscopy: Last obtained in 2017, negative, with benign polyp removed and no need for recall. Tobacco History Reviewed:  Never smoker. Abdominal Ultrasound:     ( Unnecessary - no smoking hx) Alcohol:  No concerns, no excessive use Exercise Habits:  Was dancing prior to COVID; continues playing golf, walks the dog, has been doing yoga and exercise class (but notes this is being closed down for a week or so during the current local increase in COVID cases). 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  States that the last time he had an HIV / STD screen was probably 20  years ago.  Notes he hasn't had a herpes outbreak since 2013-2014. Drug Use:  None. Birth control method: None reported. Testicular/penile concerns: None reported.   Patient states he has had a two-series shingles vaccine in the past, but is unsure whether it was shingrix or the zoster vaccination.  Denies concerns with falling; states he has had "little falls," but "no passing out."  Notes he donates blood regularly.  He is A negative.  - Eye Health Had cataract surgery on both eyes in October in November, and followed up with the eye doctor last week.  He goes at least once per year.  - Dental Care He just had his teeth cleaned last week.  Follows up with [facility]; last went a year or year and a half ago.  Knows he followed up in 2019 but is unsure whether or not he followed up in 2020.  - [place] Denies bowel concerns; notes his constipation is much better and attributes this to more water and taking fiber pills.  - Urinary Health Follows up with urology yearly.  Denies urinary concerns.  -  Weight Notes he has lost ten lbs over the past year, and while he is active, he is unsure why this weight loss occurred.  Patient weighs 154 lbs today, and weighed 161 lbs in December 2019.  He weighed 159 lbs in October of 2019.   Immunization History  Administered Date(s) Administered  . Fluad Quad(high Dose 65+) 12/24/2018  . Influenza, High Dose Seasonal PF 12/16/2013, 12/12/2015, 01/09/2017, 01/14/2018  . Influenza-Unspecified 03/12/2011, 01/09/2017  . Pneumococcal Conjugate-13 01/17/2009, 09/16/2013  . Pneumococcal Polysaccharide-23 09/16/2013  . Td 08/17/2008  . Tdap 04/12/2019  . Zoster 12/16/2013, 12/12/2015  . Zoster Recombinat (Shingrix) 04/12/2019    Health Maintenance  Topic Date Due  . Hepatitis C Screening  11/26/2028 (Originally November 13, 1945)  . COLONOSCOPY  06/05/2025  . TETANUS/TDAP  04/11/2029  . INFLUENZA VACCINE  Completed  . PNA vac Low Risk  Adult  Discontinued      Wt Readings from Last 3 Encounters:  04/12/19 154 lb 8 oz (70.1 kg)  02/04/19 157 lb 6.4 oz (71.4 kg)  10/06/18 162 lb 6.4 oz (73.7 kg)   BP Readings from Last 3 Encounters:  04/12/19 130/73  02/04/19 105/64  10/06/18 117/71   Pulse Readings from Last 3 Encounters:  04/12/19 (!) 59  02/04/19 67  10/06/18 (!) 58    Patient Active Problem List   Diagnosis Date Noted  . Glucose intolerance (impaired glucose tolerance) 12/12/2016  . CKD (chronic kidney disease), stage III 12/12/2016  . Need for shingles vaccine 04/12/2019  . Need for Tdap vaccination 04/12/2019  . Osteoarthritis of knee 02/04/2019  . Impacted cerumen 02/04/2019  . Decreased hearing, left 11/20/2018  . History of chronic constipation 11/20/2018  . Iron deficiency anemia 12/30/2017  . h/o B12 deficiency 12/30/2017  . Muscle spasms- b/l upper traps 09/18/2017  . Tingling in extremities 09/12/2017  . 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 Neuropathy of forearm, right 09/12/2017  . Arthropathy of cervical facet joint 09/12/2017  . Vitamin D deficiency 06/26/2017  . Plantar fasciitis of left foot 03/26/2017  . 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 Elevated PSA 11/28/2016  . Generalized OA 11/26/2016  . BPH (benign prostatic hyperplasia) 11/26/2016  . Environmental and seasonal allergies 11/26/2016  . h/o Inguinal hernia- b/l and abd hernia repaired with mesh 11/26/2016  . S/P colonoscopy 11/26/2016  . h/o Renal stones- ca oxalate.  11/26/2016  . Cervicalgia 11/26/2016  . Peripheral neuropathy 11/26/2016    Past Medical History:  Diagnosis Date  . Arthritis   . BPH (benign prostatic hyperplasia)   . ED (erectile dysfunction)   . Elevated PSA   . History of acute renal failure    08/ 2017  . History of adenomatous polyp of colon    2005 TUBULAR ADENOMA  . History of herpes genitalis   . History of kidney stones   . Hypogonadism, testicular   . Lower urinary  tract symptoms (LUTS)   . Nephrolithiasis    multiple right side non-obstructive per ct 11-27-2015  . Peyronie's disease   . Right ureteral stone     Past Surgical History:  Procedure Laterality Date  . ABDOMINAL HERNIA REPAIR  02/2014  . COLONOSCOPY WITH PROPOFOL N/A 06/06/2015   Procedure: COLONOSCOPY WITH PROPOFOL;  Surgeon: [name], MD;  Location: [facility];  Service: Endoscopy;  Laterality: N/A;  . CYSTOSCOPY WITH RETROGRADE PYELOGRAM, URETEROSCOPY AND STENT PLACEMENT Right 11/30/2015   Procedure: CYSTOSCOPY/RETROGRADE/URETEROSCOPY/;  Surgeon: [name], MD;  Location[facility];  Service: Urology;  Laterality: Right;  . CYSTOSCOPY/URETEROSCOPY/HOLMIUM LASER/STENT PLACEMENT Right 12/20/2015   Procedure: CYSTOSCOPY/URETEROSCOPY/HOLMIUM LASER/ REMOVE RIGHT JJ STENT;  Surgeon: [name], MD;  Location: [facility];  Service: Urology;  Laterality: Right;  1 HOUR  . CYSTOSCOPY/URETEROSCOPY/HOLMIUM LASER/STENT PLACEMENT Right 01/13/2018   Procedure: CYSTOSCOPY/URETEROSCOPY/HOLMIUM LASER/STONE REMOVAL/STENT PLACEMENT;  Surgeon: [name], MD;  Location: [facility];  Service: Urology;  Laterality: Right;  . INGUINAL HERNIA REPAIR Left 2001  . KNEE ARTHROSCOPY Left 2005  . NESBIT PLICATION FOR PEYRONIE'S DISEASE  05/01/2013   and Right Inguinal Hernia Repair  . TONSILLECTOMY  child    Family History  Problem Relation Age of Onset  . Alcohol abuse Mother   . Cancer Father        Brain  . Cancer Sister        breast  . Hypertension Paternal Grandmother   . Depression Paternal Grandmother        suicide    Social History   Substance and Sexual Activity  Drug Use No  ,  Social History   Substance and Sexual Activity  Alcohol Use Yes  . Alcohol/week: 4.0 standard drinks  . Types: 4 Cans of beer per week  ,  Social History   Tobacco Use  Smoking Status Never Smoker  Smokeless Tobacco Never Used  ,  Social History    Substance and Sexual Activity  Sexual Activity Yes  . Birth control/protection: None    Patient's Medications  New Prescriptions   No medications on file  Previous Medications   B COMPLEX VITAMINS (B COMPLEX PO)    Take by mouth.   CHOLECALCIFEROL (VITAMIN D-3) 5000 UNITS TABS    Take 1 tablet by mouth every morning.   CO-ENZYME Q-10 30 MG CAPSULE    Take 30 mg by mouth 3 (three) times daily.   FERROUS SULFATE 325 (65 FE) MG TABLET    Take 325 mg by mouth daily with breakfast.   FEXOFENADINE (ALLEGRA) 180 MG TABLET    Take 180 mg by mouth 3 (three) times a week. Jan- March.    MENAQUINONE-7 (VITAMIN K2) 100 MCG CAPS    Take by mouth.   MULTIPLE VITAMIN (MULTIVITAMIN WITH MINERALS) TABS TABLET    Take 1 tablet by mouth daily.   OMEGA-3 FATTY ACIDS (OMEGA 3 PO)    Take 1 tablet by mouth every morning.   OVER THE COUNTER MEDICATION       TAMSULOSIN (FLOMAX) 0.4 MG CAPS CAPSULE    Take 0.4 mg by mouth every morning.   TESTOSTERONE 10 MG/ACT (2%) GEL    Apply 1 application topically daily.   TURMERIC 450 MG CAPS    Take 1 capsule by mouth daily.   VITAMIN A 96295 UNIT CAPSULE    Take 10,000 Units by mouth daily.   VITAMIN B-12 (CYANOCOBALAMIN) 100 MCG TABLET    Take 100 mcg by mouth daily.  Modified Medications   No medications on file  Discontinued Medications   No medications on file    Patient has no known allergies.  Review of Systems: General:   Denies fever, chills, unexplained weight loss.  Optho/Auditory:   Denies visual changes, blurred vision/LOV Respiratory:   Denies SOB, DOE more than baseline levels.   Cardiovascular:   Denies chest pain, palpitations, new onset peripheral edema  Gastrointestinal:   Denies nausea, vomiting, diarrhea.  Genitourinary: Denies dysuria, freq/ urgency, flank pain or discharge from genitals.  Endocrine:     Denies  hot or cold intolerance, polyuria, polydipsia. Musculoskeletal:   Denies unexplained myalgias, joint swelling, unexplained  arthralgias, gait problems.  Skin:  Denies rash, suspicious lesions Neurological:     Denies dizziness, unexplained weakness, numbness  Psychiatric/Behavioral:   Denies mood changes, suicidal or homicidal ideations, hallucinations    Objective:     Blood pressure 130/73, pulse (!) 59, temperature 97.8 F (36.6 C), temperature source Oral, resp. rate 12, height 5\' 9"  (1.753 m), weight 154 lb 8 oz (70.1 kg). Body mass index is 22.82 kg/m. General Appearance:    Alert, cooperative, no distress, appears stated age  Head:    Normocephalic, without obvious abnormality, atraumatic  Eyes:    PERRL, conjunctiva/corneas clear, EOM's intact, fundi    benign, both eyes  Ears:    Normal TM's and external ear canals, both ears  Nose:   Nares normal, septum midline, mucosa normal, no drainage    or sinus tenderness  Throat:   Lips w/o lesion, mucosa moist, and tongue normal; teeth and   gums normal  Neck:   Supple, symmetrical, trachea midline, no adenopathy;    thyroid:  no enlargement/tenderness/nodules; no carotid   bruit or JVD  Back:     Symmetric, no curvature, ROM normal, no CVA tenderness  Lungs:     Clear to auscultation bilaterally, respirations unlabored, no       Wh/ R/ R  Chest Wall:    No tenderness or gross deformity; normal excursion   Heart:    Regular rate and rhythm, S1 and S2 normal, no murmur, rub   or gallop  Abdomen:     Soft, non-tender, bowel sounds active all four quadrants, NO   G/R/R, no masses, no organomegaly  Genitalia:     Deferred as patient sees Urology twice yearly for these exams.  Rectal:     Deferred as patient sees Urology twice yearly for these exams.  Extremities:   Extremities normal, atraumatic, no cyanosis or gross edema  Pulses:   2+ and symmetric all extremities  Skin:   Warm, dry, Skin color, texture, turgor normal, no obvious rashes or lesions  M-Sk:   Ambulates * 4 w/o difficulty, no gross deformities, tone WNL  Neurologic:   CNII-XII intact,  normal strength, sensation and reflexes    Throughout Psych:  No HI/SI, judgement and insight good, Euthymic mood. Full Affect.

## 2019-04-12 NOTE — Patient Instructions (Signed)
 Preventive Care 75 Years and Older, Male Preventive care refers to lifestyle choices and visits with your health care provider that can promote health and wellness. What does preventive care include?   A yearly physical exam. This is also called an annual well check.  Dental exams once or twice a year.  Routine eye exams. Ask your health care provider how often you should have your eyes checked.  Personal lifestyle choices, including: ? Daily care of your teeth and gums. ? Regular physical activity. ? Eating a healthy diet. ? Avoiding tobacco and drug use. ? Limiting alcohol use. ? Practicing safe sex. ? Taking low doses of aspirin every day. ? Taking vitamin and mineral supplements as recommended by your health care provider. What happens during an annual well check? The services and screenings done by your health care provider during your annual well check will depend on your age, overall health, lifestyle risk factors, and family history of disease. Counseling Your health care provider may ask you questions about your:  Alcohol use.  Tobacco use.  Drug use.  Emotional well-being.  Home and relationship well-being.  Sexual activity.  Eating habits.  History of falls.  Memory and ability to understand (cognition).  Work and work environment. Screening You may have the following tests or measurements:  Height, weight, and BMI.  Blood pressure.  Lipid and cholesterol levels. These may be checked every 5 years, or more frequently if you are over 50 years old.  Skin check.  Lung cancer screening. You may have this screening every year starting at age 55 if you have a 30-pack-year history of smoking and currently smoke or have quit within the past 15 years.  Colorectal cancer screening. All adults should have this screening starting at age 50 and continuing until age 75. You will have tests every 1-10 years, depending on your results and the type of screening  test. People at increased risk should start screening at an earlier age. Screening tests may include: ? Guaiac-based fecal occult blood testing. ? Fecal immunochemical test (FIT). ? Stool DNA test. ? Virtual colonoscopy. ? Sigmoidoscopy. During this test, a flexible tube with a tiny camera (sigmoidoscope) is used to examine your rectum and lower colon. The sigmoidoscope is inserted through your anus into your rectum and lower colon. ? Colonoscopy. During this test, a long, thin, flexible tube with a tiny camera (colonoscope) is used to examine your entire colon and rectum.  Prostate cancer screening. Recommendations will vary depending on your family history and other risks.  Hepatitis C blood test.  Hepatitis B blood test.  Sexually transmitted disease (STD) testing.  Diabetes screening. This is done by checking your blood sugar (glucose) after you have not eaten for a while (fasting). You may have this done every 1-3 years.  Abdominal aortic aneurysm (AAA) screening. You may need this if you are a current or former smoker.  Osteoporosis. You may be screened starting at age 70 if you are at high risk. Talk with your health care provider about your test results, treatment options, and if necessary, the need for more tests. Vaccines Your health care provider may recommend certain vaccines, such as:  Influenza vaccine. This is recommended every year.  Tetanus, diphtheria, and acellular pertussis (Tdap, Td) vaccine. You may need a Td booster every 10 years.  Varicella vaccine. You may need this if you have not been vaccinated.  Zoster vaccine. You may need this after age 60.  Measles, mumps, and rubella (MMR)   vaccine. You may need at least one dose of MMR if you were born in 1957 or later. You may also need a second dose.  Pneumococcal 13-valent conjugate (PCV13) vaccine. One dose is recommended after age 24.  Pneumococcal polysaccharide (PPSV23) vaccine. One dose is recommended  after age 30.  Meningococcal vaccine. You may need this if you have certain conditions.  Hepatitis A vaccine. You may need this if you have certain conditions or if you travel or work in places where you may be exposed to hepatitis A.  Hepatitis B vaccine. You may need this if you have certain conditions or if you travel or work in places where you may be exposed to hepatitis B.  Haemophilus influenzae type b (Hib) vaccine. You may need this if you have certain risk factors. Talk to your health care provider about which screenings and vaccines you need and how often you need them. This information is not intended to replace advice given to you by your health care provider. Make sure you discuss any questions you have with your health care provider. Document Released: 04/28/2015 Document Revised: 05/22/2017 Document Reviewed: 01/31/2015 Elsevier Interactive Patient Education  2019 [place] for Adults, Male A healthy lifestyle and preventive care can promote health and wellness. Preventive health guidelines for men include the following key practices:  A routine yearly physical is a good way to check with your health care provider about your health and preventative screening. It is a chance to share any concerns and updates on your health and to receive a thorough exam.  Visit your dentist for a routine exam and preventative care every 6 months. Brush your teeth twice a day and floss once a day. Good oral hygiene prevents tooth decay and gum disease.  The frequency of eye exams is based on your age, health, family medical history, use of contact lenses, and other factors. Follow your health care provider's recommendations for frequency of eye exams.  Eat a healthy diet. Foods such as vegetables, fruits, whole grains, low-fat dairy products, and lean protein foods contain the nutrients you need without too many calories. Decrease your intake of foods high in  solid fats, added sugars, and salt. Eat the right amount of calories for you. Get information about a proper diet from your health care provider, if necessary.  Regular physical exercise is one of the most important things you can do for your health. Most adults should get at least 150 minutes of moderate-intensity exercise (any activity that increases your heart rate and causes you to sweat) each week. In addition, most adults need muscle-strengthening exercises on 2 or more days a week.  Maintain a healthy weight. The body mass index (BMI) is a screening tool to identify possible weight problems. It provides an estimate of body fat based on height and weight. Your health care provider can find your BMI and can help you achieve or maintain a healthy weight. For adults 20 years and older:  A BMI below 18.5 is considered underweight.  A BMI of 18.5 to 24.9 is normal.  A BMI of 25 to 29.9 is considered overweight.  A BMI of 30 and above is considered obese.  Maintain normal blood lipids and cholesterol levels by exercising and minimizing your intake of saturated fat. Eat a balanced diet with plenty of fruit and vegetables. Blood tests for lipids and cholesterol should begin at age 68 and be repeated every 5 years. If  your lipid or cholesterol levels are high, you are over 50, or you are at high risk for heart disease, you may need your cholesterol levels checked more frequently. Ongoing high lipid and cholesterol levels should be treated with medicines if diet and exercise are not working.  If you smoke, find out from your health care provider how to quit. If you do not use tobacco, do not start.  Lung cancer screening is recommended for adults aged 31-80 years who are at high risk for developing lung cancer because of a history of smoking. A yearly low-dose CT scan of the lungs is recommended for people who have at least a 30-pack-year history of smoking and are a current smoker or have quit within  the past 15 years. A pack year of smoking is smoking an average of 1 pack of cigarettes a day for 1 year (for example: 1 pack a day for 30 years or 2 packs a day for 15 years). Yearly screening should continue until the smoker has stopped smoking for at least 15 years. Yearly screening should be stopped for people who develop a health problem that would prevent them from having lung cancer treatment.  If you choose to drink alcohol, do not have more than 2 drinks per day. One drink is considered to be 12 ounces (355 mL) of beer, 5 ounces (148 mL) of wine, or 1.5 ounces (44 mL) of liquor.  Avoid use of street drugs. Do not share needles with anyone. Ask for help if you need support or instructions about stopping the use of drugs.  High blood pressure causes heart disease and increases the risk of stroke. Your blood pressure should be checked at least every 1-2 years. Ongoing high blood pressure should be treated with medicines, if weight loss and exercise are not effective.  If you are 55-53 years old, ask your health care provider if you should take aspirin to prevent heart disease.  Diabetes screening is done by taking a blood sample to check your blood glucose level after you have not eaten for a certain period of time (fasting). If you are not overweight and you do not have risk factors for diabetes, you should be screened once every 3 years starting at age 75. If you are overweight or obese and you are 32-46 years of age, you should be screened for diabetes every year as part of your cardiovascular risk assessment.  Colorectal cancer can be detected and often prevented. Most routine colorectal cancer screening begins at the age of 64 and continues through age 61. However, your health care provider may recommend screening at an earlier age if you have risk factors for colon cancer. On a yearly basis, your health care provider may provide home test kits to check for hidden blood in the stool. Use of a  small camera at the end of a tube to directly examine the colon (sigmoidoscopy or colonoscopy) can detect the earliest forms of colorectal cancer. Talk to your health care provider about this at age 60, when routine screening begins. Direct exam of the colon should be repeated every 5-10 years through age 56, unless early forms of precancerous polyps or small growths are found.  People who are at an increased risk for hepatitis B should be screened for this virus. You are considered at high risk for hepatitis B if:  You were born in a country where hepatitis B occurs often. Talk with your health care provider about which countries are considered high  a high-risk country and you have not received a shot to protect against hepatitis B (hepatitis B vaccine).  You have HIV or AIDS.  You use needles to inject street drugs.  You live with, or have sex with, someone who has hepatitis B.  You are a man who has sex with other men (MSM).  You get hemodialysis treatment.  You take certain medicines for conditions such as cancer, organ transplantation, and autoimmune conditions.  Hepatitis C blood testing is recommended for all people born from 1945 through 1965 and any individual with known risks for hepatitis C.  Practice safe sex. Use condoms and avoid high-risk sexual practices to reduce the spread of sexually transmitted infections (STIs). STIs include gonorrhea, chlamydia, syphilis, trichomonas, herpes, HPV, and human immunodeficiency virus (HIV). Herpes, HIV, and HPV are viral illnesses that have no cure. They can result in disability, cancer, and death.  If you are a man who has sex with other men, you should be screened at least once per year for:  HIV.  Urethral, rectal, and pharyngeal infection of gonorrhea, chlamydia, or both.  If you are at risk of being infected with HIV, it is recommended that you take a prescription medicine daily to prevent HIV infection. This  is called preexposure prophylaxis (PrEP). You are considered at risk if:  You are a man who has sex with other men (MSM) and have other risk factors.  You are a heterosexual man, are sexually active, and are at increased risk for HIV infection.  You take drugs by injection.  You are sexually active with a partner who has HIV.  Talk with your health care provider about whether you are at high risk of being infected with HIV. If you choose to begin PrEP, you should first be tested for HIV. You should then be tested every 3 months for as long as you are taking PrEP.  A one-time screening for abdominal aortic aneurysm (AAA) and surgical repair of large AAAs by ultrasound are recommended for men ages 65 to 75 years who are current or former smokers.  Healthy men should no longer receive prostate-specific antigen (PSA) blood tests as part of routine cancer screening. Talk with your health care provider about prostate cancer screening.  Testicular cancer screening is not recommended for adult males who have no symptoms. Screening includes self-exam, a health care provider exam, and other screening tests. Consult with your health care provider about any symptoms you have or any concerns you have about testicular cancer.  Use sunscreen. Apply sunscreen liberally and repeatedly throughout the day. You should seek shade when your shadow is shorter than you. Protect yourself by wearing long sleeves, pants, a wide-brimmed hat, and sunglasses year round, whenever you are outdoors.  Once a month, do a whole-body skin exam, using a mirror to look at the skin on your back. Tell your health care provider about new moles, moles that have irregular borders, moles that are larger than a pencil eraser, or moles that have changed in shape or color.  Stay current with required vaccines (immunizations).  Influenza vaccine. All adults should be immunized every year.  Tetanus, diphtheria, and acellular pertussis (Td,  Tdap) vaccine. An adult who has not previously received Tdap or who does not know his vaccine status should receive 1 dose of Tdap. This initial dose should be followed by tetanus and diphtheria toxoids (Td) booster doses every 10 years. Adults with an unknown or incomplete history of completing a 3-dose immunization series with   Td-containing vaccines should begin or complete a primary immunization series including a Tdap dose. Adults should receive a Td booster every 10 years.  Varicella vaccine. An adult without evidence of immunity to varicella should receive 2 doses or a second dose if he has previously received 1 dose.  Human papillomavirus (HPV) vaccine. Males aged 11-21 years who have not received the vaccine previously should receive the 3-dose series. Males aged 22-26 years may be immunized. Immunization is recommended through the age of 26 years for any male who has sex with males and did not get any or all doses earlier. Immunization is recommended for any person with an immunocompromised condition through the age of 26 years if he did not get any or all doses earlier. During the 3-dose series, the second dose should be obtained 4-8 weeks after the first dose. The third dose should be obtained 24 weeks after the first dose and 16 weeks after the second dose.  Zoster vaccine. One dose is recommended for adults aged 60 years or older unless certain conditions are present.  Measles, mumps, and rubella (MMR) vaccine. Adults born before 1957 generally are considered immune to measles and mumps. Adults born in 1957 or later should have 1 or more doses of MMR vaccine unless there is a contraindication to the vaccine or there is laboratory evidence of immunity to each of the three diseases. A routine second dose of MMR vaccine should be obtained at least 28 days after the first dose for students attending postsecondary schools, health care workers, or international travelers. People who received  inactivated measles vaccine or an unknown type of measles vaccine during 1963-1967 should receive 2 doses of MMR vaccine. People who received inactivated mumps vaccine or an unknown type of mumps vaccine before 1979 and are at high risk for mumps infection should consider immunization with 2 doses of MMR vaccine. Unvaccinated health care workers born before 1957 who lack laboratory evidence of measles, mumps, or rubella immunity or laboratory confirmation of disease should consider measles and mumps immunization with 2 doses of MMR vaccine or rubella immunization with 1 dose of MMR vaccine.  Pneumococcal 13-valent conjugate (PCV13) vaccine. When indicated, a person who is uncertain of his immunization history and has no record of immunization should receive the PCV13 vaccine. All adults 75 years of age and older should receive this vaccine. An adult aged 19 years or older who has certain medical conditions and has not been previously immunized should receive 1 dose of PCV13 vaccine. This PCV13 should be followed with a dose of pneumococcal polysaccharide (PPSV23) vaccine. Adults who are at high risk for pneumococcal disease should obtain the PPSV23 vaccine at least 8 weeks after the dose of PCV13 vaccine. Adults older than 75 years of age who have normal immune system function should obtain the PPSV23 vaccine dose at least 1 year after the dose of PCV13 vaccine.  Pneumococcal polysaccharide (PPSV23) vaccine. When PCV13 is also indicated, PCV13 should be obtained first. All adults aged 75 years and older should be immunized. An adult younger than age 75 years who has certain medical conditions should be immunized. Any person who resides in a nursing home or long-term care facility should be immunized. An adult smoker should be immunized. People with an immunocompromised condition and certain other conditions should receive both PCV13 and PPSV23 vaccines. People with human immunodeficiency virus (HIV) infection  should be immunized as soon as possible after diagnosis. Immunization during chemotherapy or radiation therapy should be avoided. Routine   use of PPSV23 vaccine is not recommended for American Indians, [place] Natives, or people younger than 65 years unless there are medical conditions that require PPSV23 vaccine. When indicated, people who have unknown immunization and have no record of immunization should receive PPSV23 vaccine. One-time revaccination 5 years after the first dose of PPSV23 is recommended for people aged 19-64 years who have chronic kidney failure, nephrotic syndrome, asplenia, or immunocompromised conditions. People who received 1-2 doses of PPSV23 before age 75 years should receive another dose of PPSV23 vaccine at age 75 years or later if at least 5 years have passed since the previous dose. Doses of PPSV23 are not needed for people immunized with PPSV23 at or after age 75 years.  Meningococcal vaccine. Adults with asplenia or persistent complement component deficiencies should receive 2 doses of quadrivalent meningococcal conjugate (MenACWY-D) vaccine. The doses should be obtained at least 2 months apart. Microbiologists working with certain meningococcal bacteria, military recruits, people at risk during an outbreak, and people who travel to or live in countries with a high rate of meningitis should be immunized. A first-year college student up through age 21 years who is living in a residence hall should receive a dose if he did not receive a dose on or after his 16th birthday. Adults who have certain high-risk conditions should receive one or more doses of vaccine.  Hepatitis A vaccine. Adults who wish to be protected from this disease, have chronic liver disease, work with hepatitis A-infected animals, work in hepatitis A research labs, or travel to or work in countries with a high rate of hepatitis A should be immunized. Adults who were previously unvaccinated and who anticipate close  contact with an international adoptee during the first 60 days after arrival in the [place] from a country with a high rate of hepatitis A should be immunized.  Hepatitis B vaccine. Adults should be immunized if they wish to be protected from this disease, are under age 59 years and have diabetes, have chronic liver disease, have had more than one sex partner in the past 6 months, may be exposed to blood or other infectious body fluids, are household contacts or sex partners of hepatitis B positive people, are clients or workers in certain care facilities, or travel to or work in countries with a high rate of hepatitis B.  Haemophilus influenzae type b (Hib) vaccine. A previously unvaccinated person with asplenia or sickle cell disease or having a scheduled splenectomy should receive 1 dose of Hib vaccine. Regardless of previous immunization, a recipient of a hematopoietic stem cell transplant should receive a 3-dose series 6-12 months after his successful transplant. Hib vaccine is not recommended for adults with HIV infection. Preventive Service / Frequency Ages 19 to 39  Blood pressure check.** / Every 3-5 years.  Lipid and cholesterol check.** / Every 5 years beginning at age 20.  Hepatitis C blood test.** / For any individual with known risks for hepatitis C.  Skin self-exam. / Monthly.  Influenza vaccine. / Every year.  Tetanus, diphtheria, and acellular pertussis (Tdap, Td) vaccine.** / Consult your health care provider. 1 dose of Td every 10 years.  Varicella vaccine.** / Consult your health care provider.  HPV vaccine. / 3 doses over 6 months, if 26 or younger.  Measles, mumps, rubella (MMR) vaccine.** / You need at least 1 dose of MMR if you were born in 1957 or later. You may also need a second dose.  Pneumococcal 13-valent conjugate (PCV13) vaccine.** /   Pneumococcal 13-valent conjugate (PCV13) vaccine.** / Consult your health care provider.  Pneumococcal polysaccharide (PPSV23) vaccine.** / 1 to 2 doses  if you smoke cigarettes or if you have certain conditions.  Meningococcal vaccine.** / 1 dose if you are age 26 to 70 years and a Market researcher living in a residence hall, or have one of several medical conditions. You may also need additional booster doses.  Hepatitis A vaccine.** / Consult your health care provider.  Hepatitis B vaccine.** / Consult your health care provider.  Haemophilus influenzae type b (Hib) vaccine.** / Consult your health care provider. Ages 46 to 61  Blood pressure check.** / Every year.  Lipid and cholesterol check.** / Every 5 years beginning at age 52.  Lung cancer screening. / Every year if you are aged 43-80 years and have a 30-pack-year history of smoking and currently smoke or have quit within the past 15 years. Yearly screening is stopped once you have quit smoking for at least 15 years or develop a health problem that would prevent you from having lung cancer treatment.  Fecal occult blood test (FOBT) of stool. / Every year beginning at age 70 and continuing until age 42. You may not have to do this test if you get a colonoscopy every 10 years.  Flexible sigmoidoscopy** or colonoscopy.** / Every 5 years for a flexible sigmoidoscopy or every 10 years for a colonoscopy beginning at age 33 and continuing until age 73.  Hepatitis C blood test.** / For all people born from [birth year] through 1965 and any individual with known risks for hepatitis C.  Skin self-exam. / Monthly.  Influenza vaccine. / Every year.  Tetanus, diphtheria, and acellular pertussis (Tdap/Td) vaccine.** / Consult your health care provider. 1 dose of Td every 10 years.  Varicella vaccine.** / Consult your health care provider.  Zoster vaccine.** / 1 dose for adults aged 74 years or older.  Measles, mumps, rubella (MMR) vaccine.** / You need at least 1 dose of MMR if you were born in 1957 or later. You may also need a second dose.  Pneumococcal 13-valent conjugate (PCV13)  vaccine.** / Consult your health care provider.  Pneumococcal polysaccharide (PPSV23) vaccine.** / 1 to 2 doses if you smoke cigarettes or if you have certain conditions.  Meningococcal vaccine.** / Consult your health care provider.  Hepatitis A vaccine.** / Consult your health care provider.  Hepatitis B vaccine.** / Consult your health care provider.  Haemophilus influenzae type b (Hib) vaccine.** / Consult your health care provider. Ages 60 and over  Blood pressure check.** / Every year.  Lipid and cholesterol check.**/ Every 5 years beginning at age 23.  Lung cancer screening. / Every year if you are aged 2-80 years and have a 30-pack-year history of smoking and currently smoke or have quit within the past 15 years. Yearly screening is stopped once you have quit smoking for at least 15 years or develop a health problem that would prevent you from having lung cancer treatment.  Fecal occult blood test (FOBT) of stool. / Every year beginning at age 109 and continuing until age 52. You may not have to do this test if you get a colonoscopy every 10 years.  Flexible sigmoidoscopy** or colonoscopy.** / Every 5 years for a flexible sigmoidoscopy or every 10 years for a colonoscopy beginning at age 51 and continuing until age 75.  Hepatitis C blood test.** / For all people born from [birth year] through 1967 and any individual with known risks  aneurysm (AAA) screening.** / A one-time screening for ages 65 to 75 years who are current or former smokers.  Skin self-exam. / Monthly.  Influenza vaccine. / Every year.  Tetanus, diphtheria, and acellular pertussis (Tdap/Td) vaccine.** / 1 dose of Td every 10 years.  Varicella vaccine.** / Consult your health care provider.  Zoster vaccine.** / 1 dose for adults aged 60 years or older.  Pneumococcal 13-valent conjugate (PCV13) vaccine.** / 1 dose for all adults aged 75 years and older.  Pneumococcal polysaccharide (PPSV23)  vaccine.** / 1 dose for all adults aged 75 years and older.  Meningococcal vaccine.** / Consult your health care provider.  Hepatitis A vaccine.** / Consult your health care provider.  Hepatitis B vaccine.** / Consult your health care provider.  Haemophilus influenzae type b (Hib) vaccine.** / Consult your health care provider. **Family history and personal history of risk and conditions may change your health care provider's recommendations.   This information is not intended to replace advice given to you by your health care provider. Make sure you discuss any questions you have with your health care provider.   Document Released: 05/28/2001 Document Revised: 04/22/2014 Document Reviewed: 08/27/2010 Elsevier Interactive Patient Education 2016 Elsevier Inc. 

## 2019-04-13 LAB — CBC WITH DIFFERENTIAL/PLATELET
Basophils Absolute: 0.1 10*3/uL (ref 0.0–0.2)
Basos: 2 %
EOS (ABSOLUTE): 0.4 10*3/uL (ref 0.0–0.4)
Eos: 7 %
Hematocrit: 44.7 % (ref 37.5–51.0)
Hemoglobin: 15.3 g/dL (ref 13.0–17.7)
Immature Grans (Abs): 0 10*3/uL (ref 0.0–0.1)
Immature Granulocytes: 0 %
Lymphocytes Absolute: 1.5 10*3/uL (ref 0.7–3.1)
Lymphs: 30 %
MCH: 32.5 pg (ref 26.6–33.0)
MCHC: 34.2 g/dL (ref 31.5–35.7)
MCV: 95 fL (ref 79–97)
Monocytes Absolute: 0.5 10*3/uL (ref 0.1–0.9)
Monocytes: 9 %
Neutrophils Absolute: 2.7 10*3/uL (ref 1.4–7.0)
Neutrophils: 52 %
Platelets: 254 10*3/uL (ref 150–450)
RBC: 4.71 x10E6/uL (ref 4.14–5.80)
RDW: 11.9 % (ref 11.6–15.4)
WBC: 5.2 10*3/uL (ref 3.4–10.8)

## 2019-04-13 LAB — HEMOGLOBIN A1C
Est. average glucose Bld gHb Est-mCnc: 123 mg/dL
Hgb A1c MFr Bld: 5.9 % — ABNORMAL HIGH (ref 4.8–5.6)

## 2019-04-13 LAB — COMPREHENSIVE METABOLIC PANEL
ALT: 17 IU/L (ref 0–44)
AST: 20 IU/L (ref 0–40)
Albumin/Globulin Ratio: 1.8 (ref 1.2–2.2)
Albumin: 4.2 g/dL (ref 3.7–4.7)
Alkaline Phosphatase: 60 IU/L (ref 39–117)
BUN/Creatinine Ratio: 14 (ref 10–24)
BUN: 17 mg/dL (ref 8–27)
Bilirubin Total: 0.5 mg/dL (ref 0.0–1.2)
CO2: 28 mmol/L (ref 20–29)
Calcium: 9.6 mg/dL (ref 8.6–10.2)
Chloride: 104 mmol/L (ref 96–106)
Creatinine, Ser: 1.18 mg/dL (ref 0.76–1.27)
GFR calc Af Amer: 69 mL/min/{1.73_m2} (ref >59)
GFR calc non Af Amer: 60 mL/min/{1.73_m2} (ref >59)
Globulin, Total: 2.4 g/dL (ref 1.5–4.5)
Glucose: 98 mg/dL (ref 65–99)
Potassium: 4.9 mmol/L (ref 3.5–5.2)
Sodium: 144 mmol/L (ref 134–144)
Total Protein: 6.6 g/dL (ref 6.0–8.5)

## 2019-04-13 LAB — LIPID PANEL
Chol/HDL Ratio: 2.4 ratio (ref 0.0–5.0)
Cholesterol, Total: 155 mg/dL (ref 100–199)
HDL: 65 mg/dL (ref >39)
LDL Chol Calc (NIH): 78 mg/dL (ref 0–99)
Triglycerides: 62 mg/dL (ref 0–149)
VLDL Cholesterol Cal: 12 mg/dL (ref 5–40)

## 2019-04-13 LAB — VITAMIN D 25 HYDROXY (VIT D DEFICIENCY, FRACTURES): Vit D, 25-Hydroxy: 46.2 ng/mL (ref 30.0–100.0)

## 2019-04-13 LAB — T4, FREE: Free T4: 1.25 ng/dL (ref 0.82–1.77)

## 2019-04-13 LAB — TSH: TSH: 1.12 u[IU]/mL (ref 0.450–4.500)

## 2019-05-04 DIAGNOSIS — M19011 Primary osteoarthritis, right shoulder: Secondary | ICD-10-CM | POA: Diagnosis not present

## 2019-05-04 DIAGNOSIS — M19012 Primary osteoarthritis, left shoulder: Secondary | ICD-10-CM | POA: Diagnosis not present

## 2019-05-04 DIAGNOSIS — M170 Bilateral primary osteoarthritis of knee: Secondary | ICD-10-CM | POA: Diagnosis not present

## 2019-05-04 DIAGNOSIS — M180 Bilateral primary osteoarthritis of first carpometacarpal joints: Secondary | ICD-10-CM | POA: Diagnosis not present

## 2019-05-06 DIAGNOSIS — M7581 Other shoulder lesions, right shoulder: Secondary | ICD-10-CM | POA: Diagnosis not present

## 2019-05-06 DIAGNOSIS — M180 Bilateral primary osteoarthritis of first carpometacarpal joints: Secondary | ICD-10-CM | POA: Diagnosis not present

## 2019-09-14 ENCOUNTER — Encounter: Payer: Self-pay | Admitting: Physician Assistant

## 2019-09-14 ENCOUNTER — Other Ambulatory Visit: Payer: Self-pay

## 2019-09-14 ENCOUNTER — Ambulatory Visit: Payer: Federal, State, Local not specified - PPO | Admitting: Physician Assistant

## 2019-09-14 VITALS — BP 103/62 | HR 68 | Temp 97.9°F | Ht 69.0 in | Wt 158.7 lb

## 2019-09-14 DIAGNOSIS — Z0000 Encounter for general adult medical examination without abnormal findings: Secondary | ICD-10-CM

## 2019-09-14 DIAGNOSIS — L821 Other seborrheic keratosis: Secondary | ICD-10-CM | POA: Diagnosis not present

## 2019-09-14 DIAGNOSIS — Z1283 Encounter for screening for malignant neoplasm of skin: Secondary | ICD-10-CM

## 2019-09-14 DIAGNOSIS — Z23 Encounter for immunization: Secondary | ICD-10-CM | POA: Diagnosis not present

## 2019-09-14 DIAGNOSIS — H6121 Impacted cerumen, right ear: Secondary | ICD-10-CM

## 2019-09-14 DIAGNOSIS — L814 Other melanin hyperpigmentation: Secondary | ICD-10-CM

## 2019-09-14 DIAGNOSIS — M179 Osteoarthritis of knee, unspecified: Secondary | ICD-10-CM

## 2019-09-14 DIAGNOSIS — M1710 Unilateral primary osteoarthritis, unspecified knee: Secondary | ICD-10-CM

## 2019-09-14 NOTE — Progress Notes (Signed)
Established Patient Office Visit  Subjective:  Patient ID: Jesus [name], male    DOB: Jan 14, 1946  Age: 74 y.o. MRN: [id number]  CC:  Chief Complaint  Patient presents with  . Follow-up    HPI Jesus [name] presents for 6 month follow-up. Pt is due for second shingles vaccine and would like a skin cancer screening today. Reports doing well and has no acute concerns. Pt is followed by Jesus [name] for knee osteoarthritis. He is followed by Urology for BPH, elevated PSA, nephrolithiasis, and hypogonadism.    Past Medical History:  Diagnosis Date  . Arthritis   . BPH (benign prostatic hyperplasia)   . ED (erectile dysfunction)   . Elevated PSA   . History of acute renal failure    08/ 2017  . History of adenomatous polyp of colon    2005 TUBULAR ADENOMA  . History of herpes genitalis   . History of kidney stones   . Hypogonadism, testicular   . Lower urinary tract symptoms (LUTS)   . Nephrolithiasis    multiple right side non-obstructive per ct 11-27-2015  . Peyronie's disease   . Right ureteral stone     Past Surgical History:  Procedure Laterality Date  . ABDOMINAL HERNIA REPAIR  02/2014  . COLONOSCOPY WITH PROPOFOL N/A 06/06/2015   Procedure: COLONOSCOPY WITH PROPOFOL;  Surgeon: [name], MD;  Location: [facility];  Service: Endoscopy;  Laterality: N/A;  . CYSTOSCOPY WITH RETROGRADE PYELOGRAM, URETEROSCOPY AND STENT PLACEMENT Right 11/30/2015   Procedure: CYSTOSCOPY/RETROGRADE/URETEROSCOPY/;  Surgeon: [name], MD;  Location: [facility];  Service: Urology;  Laterality: Right;  . CYSTOSCOPY/URETEROSCOPY/HOLMIUM LASER/STENT PLACEMENT Right 12/20/2015   Procedure: CYSTOSCOPY/URETEROSCOPY/HOLMIUM LASER/ REMOVE RIGHT JJ STENT;  Surgeon: [name], MD;  Location: [facility];  Service: Urology;  Laterality: Right;  1 HOUR  . CYSTOSCOPY/URETEROSCOPY/HOLMIUM LASER/STENT PLACEMENT Right 01/13/2018   Procedure:  CYSTOSCOPY/URETEROSCOPY/HOLMIUM LASER/STONE REMOVAL/STENT PLACEMENT;  Surgeon: [name], MD;  Location: [facility];  Service: Urology;  Laterality: Right;  . INGUINAL HERNIA REPAIR Left 2001  . KNEE ARTHROSCOPY Left 2005  . NESBIT PLICATION FOR PEYRONIE'S DISEASE  05/01/2013   and Right Inguinal Hernia Repair  . TONSILLECTOMY  child    Family History  Problem Relation Age of Onset  . Alcohol abuse Mother   . Cancer Father        Brain  . Cancer Sister        breast  . Hypertension Paternal Grandmother   . Depression Paternal Grandmother        suicide    Social History   Socioeconomic History  . Marital status: Married    Spouse name: Not on file  . Number of children: Not on file  . Years of education: Not on file  . Highest education level: Not on file  Occupational History  . Not on file  Tobacco Use  . Smoking status: Never Smoker  . Smokeless tobacco: Never Used  Vaping Use  . Vaping Use: Never used  Substance and Sexual Activity  . Alcohol use: Yes    Alcohol/week: 4.0 standard drinks    Types: 4 Cans of beer per week  . Drug use: No  . Sexual activity: Yes    Birth control/protection: None  Other Topics Concern  . Not on file  Social History Narrative  . Not on file   Social Determinants of Health   Financial Resource Strain:   . Difficulty of Paying Living Expenses:  Food Insecurity:   . Worried About Charity fundraiser in the Last Year:   . Arboriculturist in the Last Year:   Transportation Needs:   . Film/video editor (Medical):   [name] Lack of Transportation (Non-Medical):   Physical Activity:   . Days of Exercise per Week:   . Minutes of Exercise per Session:   Stress:   . Feeling of Stress :   Social Connections:   . Frequency of Communication with Friends and Family:   . Frequency of Social Gatherings with Friends and Family:   . Attends Religious Services:   . Active Member of Clubs or Organizations:   . Attends Theatre manager Meetings:   [name] Marital Status:   Intimate Partner Violence:   . Fear of Current or Ex-Partner:   . Emotionally Abused:   [name] Physically Abused:   . Sexually Abused:     Outpatient Medications Prior to Visit  Medication Sig Dispense Refill  . B Complex Vitamins (B COMPLEX PO) Take by mouth.    . Cholecalciferol (VITAMIN D-3) 5000 units TABS Take 1 tablet by mouth every morning.    [name] co-enzyme Q-10 30 MG capsule Take 30 mg by mouth 3 (three) times daily.    . ferrous sulfate 325 (65 FE) MG tablet Take 325 mg by mouth daily with breakfast.    . fexofenadine (ALLEGRA) 180 MG tablet Take 180 mg by mouth 3 (three) times a week. Jan- March.     . Menaquinone-7 (VITAMIN K2) 100 MCG CAPS Take by mouth.    . Multiple Vitamin (MULTIVITAMIN WITH MINERALS) TABS tablet Take 1 tablet by mouth daily.    . Omega-3 Fatty Acids (OMEGA 3 PO) Take 1 tablet by mouth every morning.    [name] OVER THE COUNTER MEDICATION     . tamsulosin (FLOMAX) 0.4 MG CAPS capsule Take 0.4 mg by mouth every morning.  3  . Testosterone 10 MG/ACT (2%) GEL Apply 1 application topically daily.    . Turmeric 450 MG CAPS Take 1 capsule by mouth daily.    . vitamin A 10000 UNIT capsule Take 10,000 Units by mouth daily.    . vitamin B-12 (CYANOCOBALAMIN) 100 MCG tablet Take 100 mcg by mouth daily.     No facility-administered medications prior to visit.    No Known Allergies  ROS Review of Systems Review of Systems: General: Denies fever, chills, unexplained weight loss.  Optho/Auditory:  Denies visual changes, blurred vision/LOV Respiratory: Denies SOB, DOE more than baseline levels.  Cardiovascular: Denies chest pain, palpitations, new onset peripheral edema  Gastrointestinal: Denies nausea, vomiting, diarrhea.  Genitourinary: Denies dysuria, freq/ urgency, flank pain   Endocrine:  Denies hot or cold intolerance, polyuria, polydipsia. Musculoskeletal: Denies unexplained joint swelling, unexplained arthralgias, gait  problems.  Skin: Denies rash, suspicious lesions Neurological: Denies dizziness, unexplained weakness, numbness  Psychiatric/Behavioral: Denies mood changes, suicidal or homicidal ideations, hallucinations    Objective:    Physical Exam General: Well nourished, in no apparent distress. HEENT: PERRLA, conjunctiva clr. Normal left TM. Cerumen impaction of right ear obstructed TM view.   Resp: Respiratory effort- normal, ECTA B/L w/o W/R/R  Cardio: RRR w/o MRGs.  Abdomen: no gross distention. Lymphatics:  less 2 sec cap RF M-sk: Full ROM, normal strength, normal gait.  Skin: Warm, dry. Diffuse sun spots with few SK on back noted.   Neuro: Alert, Oriented Psych: Normal affect, Insight and Judgment appropriate.    BP 103/62  Pulse 68   Temp 97.9 F (36.6 C) (Oral)   Ht 5\' 9"  (1.753 m)   Wt 158 lb 11.2 oz (72 kg)   SpO2 97% Comment: on RA  BMI 23.44 kg/m  Wt Readings from Last 3 Encounters:  09/14/19 158 lb 11.2 oz (72 kg)  04/12/19 154 lb 8 oz (70.1 kg)  02/04/19 157 lb 6.4 oz (71.4 kg)     Health Maintenance Due  Topic Date Due  . COVID-19 Vaccine (1) Never done    There are no preventive care reminders to display for this patient.  Lab Results  Component Value Date   TSH 1.120 04/12/2019   Lab Results  Component Value Date   WBC 5.5 09/23/2019   HGB 14.8 09/23/2019   HCT 42.5 09/23/2019   MCV 95 09/23/2019   PLT 320 09/23/2019   Lab Results  Component Value Date   NA 147 (H) 09/23/2019   K 4.2 09/23/2019   CO2 28 09/23/2019   GLUCOSE 97 09/23/2019   BUN 21 09/23/2019   CREATININE 1.30 (H) 09/23/2019   BILITOT 0.3 09/23/2019   ALKPHOS 60 09/23/2019   AST 26 09/23/2019   ALT 21 09/23/2019   PROT 6.8 09/23/2019   ALBUMIN 4.5 09/23/2019   CALCIUM 9.4 09/23/2019   ANIONGAP 9 11/27/2015   Lab Results  Component Value Date   CHOL 155 04/12/2019   Lab Results  Component Value Date   HDL 65 04/12/2019   Lab Results  Component Value Date    LDLCALC 78 04/12/2019   Lab Results  Component Value Date   TRIG 62 04/12/2019   Lab Results  Component Value Date   CHOLHDL 2.4 04/12/2019   Lab Results  Component Value Date   HGBA1C 5.9 (H) 04/12/2019      Assessment & Plan:   Problem List Items Addressed This Visit      Nervous and Auditory   Impacted cerumen    Indication: Cerumen impaction of the right ear Medical necessity statement:  On physical examination, cerumen clinically impairs significant portions of the tympanic membrane.   Consent:  Discussed benefits and risks of procedure and verbal consent obtained. Procedure: Patient was prepped for the procedure.  Utilized an otoscope to assess and take note of the ear canal, the tympanic membrane, and the presence, amount, and placement of the cerumen. Gentle water irrigation and soft plastic curette was utilized to remove cerumen. Post procedure examination: shows cerumen was removed, without trauma or injury to the ear canal or TM, which remains intact.   Post-Procedural Ear Care Instructions: Patient tolerated procedure well.  Proper ear care d/c pt. The patient is made aware that they may experience temporary vertigo, temporary hearing loss, and temporary discomfort.  If these symptom last for more than 24 hours to call the clinic or proceed to the ED/Urgent Care.         Musculoskeletal and Integument   Osteoarthritis of knee    - Treatment managed by [name]. - Continue to follow up with Orthopedics as instructed.         Other   Need for shingles vaccine - Primary   Relevant Orders   Varicella-zoster vaccine IM (Shingrix) (Completed)    Other Visit Diagnoses    Skin cancer screening       Healthcare maintenance       Solar lentiginosis       Seborrheic keratoses         Healthcare Maintenance, Skin cancer  screening: - Skin cancer screening revealed no suspicious lesions, benign lesions noted. - Second Shingrix vaccine administered.  -  Continue current medication regimen.  - Continue to stay as active as possible with golfing.  - Continue follow-up with Orthopedics and Urology as directed. - Stay well hydrated, at least 64 fl oz.  - Follow up in 6 months for annual physical and FBW.  No orders of the defined types were placed in this encounter.   Follow-up: Return in about 6 months (around 03/15/2020) for [place] and [place].    Lorrene Reid, PA-C

## 2019-09-23 ENCOUNTER — Ambulatory Visit
Admission: EM | Admit: 2019-09-23 | Discharge: 2019-09-23 | Disposition: A | Discharge status: Home / Self Care | Payer: Federal, State, Local not specified - PPO | Attending: Physician Assistant | Admitting: Physician Assistant

## 2019-09-23 ENCOUNTER — Telehealth: Payer: Self-pay | Admitting: Physician Assistant

## 2019-09-23 DIAGNOSIS — R5383 Other fatigue: Secondary | ICD-10-CM

## 2019-09-23 DIAGNOSIS — W57XXXA Bitten or stung by nonvenomous insect and other nonvenomous arthropods, initial encounter: Secondary | ICD-10-CM

## 2019-09-23 LAB — POCT URINALYSIS DIP (MANUAL ENTRY)
Bilirubin, UA: NEGATIVE
Blood, UA: NEGATIVE
Glucose, UA: NEGATIVE mg/dL
Ketones, POC UA: NEGATIVE mg/dL
Leukocytes, UA: NEGATIVE
Nitrite, UA: NEGATIVE
Protein Ur, POC: NEGATIVE mg/dL
Spec Grav, UA: >=1.03 — AB (ref 1.010–1.025)
Urobilinogen, UA: 0.2 E.U./dL
pH, UA: 6 (ref 5.0–8.0)

## 2019-09-23 MED ORDER — DOXYCYCLINE HYCLATE 100 MG PO CAPS: 100.0000 mg | ORAL_CAPSULE | Freq: Two times a day (BID) | ORAL | 0 refills | Status: DC

## 2019-09-23 NOTE — ED Provider Notes (Signed)
[facility]    CSN: [id number] Arrival date & time: 09/23/19  1502      History   Chief Complaint Chief Complaint  Patient presents with   Fatigue    HPI CHETAN MEHRING is a 74 y.o. male.   74 year old male comes in for 3-week history of generalized fatigue and body aches.  Does have baseline joint pain, without obvious changes in position.  Denies chest pain, shortness of breath, nausea, vomiting.  Denies weakness, dizziness, syncope.  Patient has had 2 tick bites in the past month, without known length of attachment.  Denies fever, URI symptoms, rashes.  Mild headaches. Denies urinary symptoms.      Past Medical History:  Diagnosis Date   Arthritis    BPH (benign prostatic hyperplasia)    ED (erectile dysfunction)    Elevated PSA    History of acute renal failure    08/ 2017   History of adenomatous polyp of colon    2005 TUBULAR ADENOMA   History of herpes genitalis    History of kidney stones    Hypogonadism, testicular    Lower urinary tract symptoms (LUTS)    Nephrolithiasis    multiple right side non-obstructive per ct 11-27-2015   Peyronie's disease    Right ureteral stone     Patient Active Problem List   Diagnosis Date Noted   Need for shingles vaccine 04/12/2019   Need for Tdap vaccination 04/12/2019   Osteoarthritis of knee 02/04/2019   Impacted cerumen 02/04/2019   Decreased hearing, left 11/20/2018   History of chronic constipation 11/20/2018   Iron deficiency anemia 12/30/2017   h/o B12 deficiency 12/30/2017   Muscle spasms- b/l upper traps 09/18/2017   Tingling in extremities 09/12/2017   Neuropathy, arm, 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 09/12/2017   Neuropathy of forearm, right 09/12/2017   Arthropathy of cervical facet joint 09/12/2017   Vitamin D deficiency 06/26/2017   Plantar fasciitis of left foot 03/26/2017   Glucose intolerance (impaired glucose tolerance) 12/12/2016   CKD (chronic kidney disease),  stage III 12/12/2016   Hypogonadism, testicular 11/28/2016   Elevated PSA 11/28/2016   Generalized OA 11/26/2016   BPH (benign prostatic hyperplasia) 11/26/2016   Environmental and seasonal allergies 11/26/2016   h/o Inguinal hernia- b/l and abd hernia repaired with mesh 11/26/2016   S/P colonoscopy 11/26/2016   h/o Renal stones- ca oxalate.  11/26/2016   Cervicalgia 11/26/2016   Peripheral neuropathy 11/26/2016    Past Surgical History:  Procedure Laterality Date   ABDOMINAL HERNIA REPAIR  02/2014   COLONOSCOPY WITH PROPOFOL N/A 06/06/2015   Procedure: COLONOSCOPY WITH PROPOFOL;  Surgeon: [name], MD;  Location: [facility];  Service: Endoscopy;  Laterality: N/A;   CYSTOSCOPY WITH RETROGRADE PYELOGRAM, URETEROSCOPY AND STENT PLACEMENT Right 11/30/2015   Procedure: CYSTOSCOPY/RETROGRADE/URETEROSCOPY/;  Surgeon: [name], MD;  Location: [facility];  Service: Urology;  Laterality: Right;   CYSTOSCOPY/URETEROSCOPY/HOLMIUM LASER/STENT PLACEMENT Right 12/20/2015   Procedure: CYSTOSCOPY/URETEROSCOPY/HOLMIUM LASER/ REMOVE RIGHT JJ STENT;  Surgeon: [name], MD;  Location: [facility];  Service: Urology;  Laterality: Right;  1 HOUR   CYSTOSCOPY/URETEROSCOPY/HOLMIUM LASER/STENT PLACEMENT Right 01/13/2018   Procedure: CYSTOSCOPY/URETEROSCOPY/HOLMIUM LASER/STONE REMOVAL/STENT PLACEMENT;  Surgeon: [name], MD;  Location: [facility];  Service: Urology;  Laterality: Right;   INGUINAL HERNIA REPAIR Left 2001   KNEE ARTHROSCOPY Left [id number]   NESBIT PLICATION FOR PEYRONIE'S DISEASE  05/01/2013   and Right Inguinal Hernia Repair   TONSILLECTOMY  child  Home Medications    Prior to Admission medications   Medication Sig Start Date End Date Taking? Authorizing Provider  B Complex Vitamins (B COMPLEX PO) Take by mouth.    [provider]  Cholecalciferol (VITAMIN D-3) 5000 units TABS Take 1 tablet by mouth every  morning.    [provider]  co-enzyme Q-10 30 MG capsule Take 30 mg by mouth 3 (three) times daily.    [provider]  doxycycline (VIBRAMYCIN) 100 MG capsule Take 1 capsule (100 mg total) by mouth 2 (two) times daily. 09/23/19   Tasia Catchings, Quintavia Rogstad V, PA-C  ferrous sulfate 325 (65 FE) MG tablet Take 325 mg by mouth daily with breakfast.    [provider]  fexofenadine (ALLEGRA) 180 MG tablet Take 180 mg by mouth 3 (three) times a week. Jan- March.     [provider]  Menaquinone-7 (VITAMIN K2) 100 MCG CAPS Take by mouth.    [provider]  Multiple Vitamin (MULTIVITAMIN WITH MINERALS) TABS tablet Take 1 tablet by mouth daily.    [provider]  Omega-3 Fatty Acids (OMEGA 3 PO) Take 1 tablet by mouth every morning.    [provider]  OVER THE COUNTER MEDICATION     [provider]  tamsulosin (FLOMAX) 0.4 MG CAPS capsule Take 0.4 mg by mouth every morning. 05/13/15   [provider]  Testosterone 10 MG/ACT (2%) GEL Apply 1 application topically daily. 12/19/16   [provider]  Turmeric 450 MG CAPS Take 1 capsule by mouth daily.    [provider]  vitamin A 10000 UNIT capsule Take 10,000 Units by mouth daily.    [provider]  vitamin B-12 (CYANOCOBALAMIN) 100 MCG tablet Take 100 mcg by mouth daily.    [provider]    Family History Family History  Problem Relation Age of Onset   Alcohol abuse Mother    Cancer Father        Brain   Cancer Sister        breast   Hypertension Paternal Grandmother    Depression Paternal Grandmother        suicide    Social History Social History   Tobacco Use   Smoking status: Never Smoker   Smokeless tobacco: Never Used  Scientific laboratory technician Use: Never used  Substance Use Topics   Alcohol use: Yes    Alcohol/week: 4.0 standard drinks    Types: 4 Cans of beer per week   Drug use: No     Allergies   Patient has no known  allergies.   Review of Systems Review of Systems  Reason unable to perform ROS: See HPI as above.     Physical Exam Triage Vital Signs ED Triage Vitals  Enc Vitals Group     BP 09/23/19 1512 125/71     Pulse Rate 09/23/19 1512 72     Resp 09/23/19 1512 16     Temp 09/23/19 1512 98.3 F (36.8 C)     Temp src --      SpO2 09/23/19 1512 93 %     Weight --      Height --      Head Circumference --      Peak Flow --      Pain Score 09/23/19 1521 0     Pain Loc --      Pain Edu? --      Excl. in [place]? --  No data found.  Updated Vital Signs BP 125/71 (BP Location: Left Arm)    Pulse 72    Temp 98.3 F (36.8 C)    Resp 16    SpO2 93%   Visual Acuity Right Eye Distance:   Left Eye Distance:   Bilateral Distance:    Right Eye Near:   Left Eye Near:    Bilateral Near:     Physical Exam Constitutional:      General: He is not in acute distress.    Appearance: Normal appearance. He is well-developed. He is not toxic-appearing or diaphoretic.  HENT:     Head: Normocephalic and atraumatic.  Eyes:     Conjunctiva/sclera: Conjunctivae normal.     Pupils: Pupils are equal, round, and reactive to light.  Cardiovascular:     Rate and Rhythm: Normal rate and regular rhythm.     Heart sounds: No murmur heard.  No friction rub. No gallop.   Pulmonary:     Effort: Pulmonary effort is normal. No respiratory distress.     Comments: Speaking in full sentences without difficulty. LCTAB Musculoskeletal:     Cervical back: Normal range of motion and neck supple.  Skin:    General: Skin is warm and dry.     Comments: Scabbing to right buttock, left flank from prior tick bite. No surrounding erythema, warmth. No other obvious rashes to the body.  Neurological:     Mental Status: He is alert and oriented to person, place, and time.      UC Treatments / Results  Labs (all labs ordered are listed, but only abnormal results are displayed) Labs Reviewed  POCT URINALYSIS DIP  (MANUAL ENTRY) - Abnormal; Notable for the following components:      Result Value   Spec Grav, UA >=1.030 (*)    All other components within normal limits  B. BURGDORFI ANTIBODIES  ROCKY MTN SPOTTED FVR ABS PNL(IGG+IGM)  CBC WITH DIFFERENTIAL/PLATELET  COMPREHENSIVE METABOLIC PANEL    EKG   Radiology No results found.  Procedures Procedures (including critical care time)  Medications Ordered in UC Medications - No data to display  Initial Impression / Assessment and Plan / UC Course  I have reviewed the triage vital signs and the nursing notes.  Pertinent labs & imaging results that were available during my care of the patient were reviewed by me and considered in my medical decision making (see chart for details).    Urine negative for infection, glucose, protein. Will draw CBC, CMP, RMSF antibodies, b burgdorfi antibodies for further evaluation. Patient with normal TSH 6 months ago, will defer for now. Given history of tick bites, not with symptoms, will cover for tick born disease with doxycycline. Push fluid. Return precautions given. Otherwise, to follow up with PCP for further workup if symptoms not improving. Patient discharged in stable condition pending lab results.   Final Clinical Impressions(s) / UC Diagnoses   Final diagnoses:  Fatigue, unspecified type  Tick bite, initial encounter    ED Prescriptions    Medication Sig Dispense Auth. Provider   doxycycline (VIBRAMYCIN) 100 MG capsule Take 1 capsule (100 mg total) by mouth 2 (two) times daily. 20 capsule Ok Edwards, PA-C     PDMP not reviewed this encounter.   Ok Edwards, PA-C 09/23/19 [phone]

## 2019-09-23 NOTE — Discharge Instructions (Signed)
Urine did not show infection, protein, sugar. Did slow slight dehydration, please increase your water intake. CBC, CMP, antibodies for tickborne disease drawn. Start doxycycline as directed to cover for tick borne disease. Make appointment with PCP, if symptoms not improving, will need follow up with PCP for further evaluation. If with chest pain, shortness of breath, go to the ED for further evaluation.

## 2019-09-23 NOTE — Telephone Encounter (Signed)
Patient called request to be tested for Lyme Disease but 2 tick bites a month - 2 wks ago.  ---Per patient experiencing lyme disease symptoms & wants to blood to be tested ---Advised pt unable to just test him w/o Appt to see provider (unfortunately no available appts the next week or so ) suggested strongly that he go to [facility] for evaluation & trmt.  --forwarding message to med asst.  --glh

## 2019-09-23 NOTE — ED Triage Notes (Signed)
Pt c/o fatigue and muscle aches x3wks and has became worse in the past week. States had a tick bite 27month ago and 2 wks ago.

## 2019-09-23 NOTE — Telephone Encounter (Signed)
Patient has been seen at [facility]. Please see UC note for further information. AS, CMA

## 2019-09-25 LAB — COMPREHENSIVE METABOLIC PANEL
ALT: 21 IU/L (ref 0–44)
AST: 26 IU/L (ref 0–40)
Albumin/Globulin Ratio: 2 (ref 1.2–2.2)
Albumin: 4.5 g/dL (ref 3.7–4.7)
Alkaline Phosphatase: 60 IU/L (ref 48–121)
BUN/Creatinine Ratio: 16 (ref 10–24)
BUN: 21 mg/dL (ref 8–27)
Bilirubin Total: 0.3 mg/dL (ref 0.0–1.2)
CO2: 28 mmol/L (ref 20–29)
Calcium: 9.4 mg/dL (ref 8.6–10.2)
Chloride: 105 mmol/L (ref 96–106)
Creatinine, Ser: 1.3 mg/dL — ABNORMAL HIGH (ref 0.76–1.27)
GFR calc Af Amer: 61 mL/min/{1.73_m2} (ref >59)
GFR calc non Af Amer: 53 mL/min/{1.73_m2} — ABNORMAL LOW (ref >59)
Globulin, Total: 2.3 g/dL (ref 1.5–4.5)
Glucose: 97 mg/dL (ref 65–99)
Potassium: 4.2 mmol/L (ref 3.5–5.2)
Sodium: 147 mmol/L — ABNORMAL HIGH (ref 134–144)
Total Protein: 6.8 g/dL (ref 6.0–8.5)

## 2019-09-25 LAB — CBC WITH DIFFERENTIAL/PLATELET
Basophils Absolute: 0.1 10*3/uL (ref 0.0–0.2)
Basos: 1 %
EOS (ABSOLUTE): 0.2 10*3/uL (ref 0.0–0.4)
Eos: 4 %
Hematocrit: 42.5 % (ref 37.5–51.0)
Hemoglobin: 14.8 g/dL (ref 13.0–17.7)
Immature Grans (Abs): 0 10*3/uL (ref 0.0–0.1)
Immature Granulocytes: 0 %
Lymphocytes Absolute: 1.7 10*3/uL (ref 0.7–3.1)
Lymphs: 31 %
MCH: 33.1 pg — ABNORMAL HIGH (ref 26.6–33.0)
MCHC: 34.8 g/dL (ref 31.5–35.7)
MCV: 95 fL (ref 79–97)
Monocytes Absolute: 0.5 10*3/uL (ref 0.1–0.9)
Monocytes: 9 %
Neutrophils Absolute: 3 10*3/uL (ref 1.4–7.0)
Neutrophils: 55 %
Platelets: 320 10*3/uL (ref 150–450)
RBC: 4.47 x10E6/uL (ref 4.14–5.80)
RDW: 11.9 % (ref 11.6–15.4)
WBC: 5.5 10*3/uL (ref 3.4–10.8)

## 2019-09-25 LAB — ROCKY MTN SPOTTED FVR ABS PNL(IGG+IGM)
RMSF IgG: NEGATIVE
RMSF IgM: 0.17 index (ref 0.00–0.89)

## 2019-09-25 LAB — B. BURGDORFI ANTIBODIES: Lyme IgG/IgM Ab: <0.91 {ISR} (ref 0.00–0.90)

## 2019-09-25 NOTE — Assessment & Plan Note (Signed)
-   Treatment managed by [name]. - Continue to follow up with Orthopedics as instructed.

## 2019-09-25 NOTE — Assessment & Plan Note (Signed)
Indication: Cerumen impaction of the right ear Medical necessity statement:  On physical examination, cerumen clinically impairs significant portions of the tympanic membrane.   Consent:  Discussed benefits and risks of procedure and verbal consent obtained. Procedure: Patient was prepped for the procedure.  Utilized an otoscope to assess and take note of the ear canal, the tympanic membrane, and the presence, amount, and placement of the cerumen. Gentle water irrigation and soft plastic curette was utilized to remove cerumen. Post procedure examination: shows cerumen was removed, without trauma or injury to the ear canal or TM, which remains intact.   Post-Procedural Ear Care Instructions: Patient tolerated procedure well.  Proper ear care d/c pt. The patient is made aware that they may experience temporary vertigo, temporary hearing loss, and temporary discomfort.  If these symptom last for more than 24 hours to call the clinic or proceed to the ED/Urgent Care.

## 2019-10-12 DIAGNOSIS — M7582 Other shoulder lesions, left shoulder: Secondary | ICD-10-CM | POA: Diagnosis not present

## 2019-10-12 DIAGNOSIS — M25552 Pain in left hip: Secondary | ICD-10-CM | POA: Diagnosis not present

## 2019-10-12 DIAGNOSIS — M5136 Other intervertebral disc degeneration, lumbar region: Secondary | ICD-10-CM | POA: Diagnosis not present

## 2019-10-12 DIAGNOSIS — M1712 Unilateral primary osteoarthritis, left knee: Secondary | ICD-10-CM | POA: Diagnosis not present

## 2019-10-12 DIAGNOSIS — M545 Low back pain: Secondary | ICD-10-CM | POA: Diagnosis not present

## 2019-10-26 DIAGNOSIS — Z125 Encounter for screening for malignant neoplasm of prostate: Secondary | ICD-10-CM | POA: Diagnosis not present

## 2019-10-26 DIAGNOSIS — E291 Testicular hypofunction: Secondary | ICD-10-CM | POA: Diagnosis not present

## 2019-11-02 DIAGNOSIS — N200 Calculus of kidney: Secondary | ICD-10-CM | POA: Diagnosis not present

## 2019-11-02 DIAGNOSIS — N5201 Erectile dysfunction due to arterial insufficiency: Secondary | ICD-10-CM | POA: Diagnosis not present

## 2019-11-02 DIAGNOSIS — E291 Testicular hypofunction: Secondary | ICD-10-CM | POA: Diagnosis not present

## 2019-12-08 DIAGNOSIS — L570 Actinic keratosis: Secondary | ICD-10-CM | POA: Diagnosis not present

## 2019-12-08 DIAGNOSIS — L298 Other pruritus: Secondary | ICD-10-CM | POA: Diagnosis not present

## 2019-12-08 DIAGNOSIS — D485 Neoplasm of uncertain behavior of skin: Secondary | ICD-10-CM | POA: Diagnosis not present

## 2019-12-08 DIAGNOSIS — B079 Viral wart, unspecified: Secondary | ICD-10-CM | POA: Diagnosis not present

## 2019-12-16 ENCOUNTER — Encounter: Payer: Self-pay | Admitting: Internal Medicine

## 2019-12-16 ENCOUNTER — Ambulatory Visit (INDEPENDENT_AMBULATORY_CARE_PROVIDER_SITE_OTHER): Payer: Federal, State, Local not specified - PPO | Admitting: Internal Medicine

## 2019-12-16 ENCOUNTER — Other Ambulatory Visit: Payer: Self-pay

## 2019-12-16 VITALS — BP 118/62 | HR 72 | Temp 97.3°F | Ht 69.0 in | Wt 156.8 lb

## 2019-12-16 DIAGNOSIS — N1831 Chronic kidney disease, stage 3a: Secondary | ICD-10-CM

## 2019-12-16 DIAGNOSIS — Z23 Encounter for immunization: Secondary | ICD-10-CM | POA: Diagnosis not present

## 2019-12-16 DIAGNOSIS — N401 Enlarged prostate with lower urinary tract symptoms: Secondary | ICD-10-CM | POA: Diagnosis not present

## 2019-12-16 DIAGNOSIS — Z8719 Personal history of other diseases of the digestive system: Secondary | ICD-10-CM

## 2019-12-16 DIAGNOSIS — E559 Vitamin D deficiency, unspecified: Secondary | ICD-10-CM | POA: Diagnosis not present

## 2019-12-16 DIAGNOSIS — M159 Polyosteoarthritis, unspecified: Secondary | ICD-10-CM | POA: Diagnosis not present

## 2019-12-16 DIAGNOSIS — M542 Cervicalgia: Secondary | ICD-10-CM

## 2019-12-16 DIAGNOSIS — R3912 Poor urinary stream: Secondary | ICD-10-CM

## 2019-12-16 DIAGNOSIS — M5136 Other intervertebral disc degeneration, lumbar region: Secondary | ICD-10-CM

## 2019-12-16 DIAGNOSIS — R739 Hyperglycemia, unspecified: Secondary | ICD-10-CM

## 2019-12-16 DIAGNOSIS — Z13220 Encounter for screening for lipoid disorders: Secondary | ICD-10-CM

## 2019-12-16 NOTE — Progress Notes (Signed)
Provider:  Rexene Edison. Mariea Clonts, D.O., C.M.D. Location:   [place]   Place of Service:   clinic  Previous PCP: Gayland Curry, DO Patient Care Team: Gayland Curry, DO as PCP - General (Geriatric Medicine) [facility] [place] (Orthopedic Surgery) Druscilla Brownie, MD as Consulting Physician (Dermatology) [name], MD as Consulting Physician (Gastroenterology) Franchot Gallo, MD as Consulting Physician (Urology) Darleen Crocker, MD as Consulting Physician (Ophthalmology) Druscilla Brownie, MD as Referring Physician (Dermatology) Berle Mull, MD as Consulting Physician (Sports Medicine)  Extended Emergency Contact Information Primary Emergency Contact: [facility] Address: [address]          [place], [place] [address] Johnnette Litter of [place] Phone: [phone] Relation: Spouse Secondary Emergency Contact: Kait[place], [place] Mobile Phone: [phone] Relation: Son Preferred language: English  Goals of Care: Advanced Directive information Advanced Directives 12/16/2019  Does Patient Have a Medical Advance Directive? Yes  Type of Paramedic of [place];Out of facility DNR (pink MOST or yellow form);Living will  Does patient want to make changes to medical advance directive? No - Patient declined  Copy of [place] in Chart? No - copy requested   Chief Complaint  Patient presents with  . Establish Care    New admit to establish care   . Health Maintenance    Patient will come back for Flu vac    HPI: Patient is a 74 y.o. Jesus [name] seen today to establish with [facility].  Records have been requested from [name] at [facility] are in [place].  They liked her a lot.    He's concerned about advancing age and things that accompany it.  He has no major big things.    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  He stays active.  He  recently discovered he has some back problems.  Used to only be with really strenuous work.  Would roll on his tennis ball.  Berle Mull informed him his back was in horrible shape with upper lumbar DDD and cervical OA.  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  He'd had knee but not hip problems.  His father had 3 hip operations.  Started to have left hip pain and that was what was coming from his back.  He took a steroid taper.  Now uses aleve some and continues exercise.  Discussed tylenol use.  Uses a horse linament.    He's also struggled with kidney stones since about 4 yrs ago.  He took precautions but 2 yrs later, recurred.  Drinks lots of water and lemon juice.  No kidney stones last check--Dr. [name].  Sometimes has weaker urinary stream.  Still going.    He's had 3 ticks this year and 5 yellow jacket stings, several spider bites.    He will take his flu shot today.    They have a cat and a dog now (border collie).    Had a heat rash over abdomen and back.  Lasted about a month.  Finally saw derm and it was a heat rash.  Cleared up in 4-5 days.  Has allergies about year round--jan/feb/mar worst.  A little in fall a problem too.  Better the last few years.    Less herpes than in the past--none since 2013.    Constipation has gotten a lot better with more fiber and fiber pills.    He does not take all of his supplements daily.  He has cut down on his sugar intake.  Had been prediabetic.    CPEs are end of December.    Past Medical History:  Diagnosis Date  . Arthritis   . BPH (benign prostatic hyperplasia)   . ED (erectile dysfunction)   . Elevated PSA   . History of acute renal failure    08/ 2017  . History of adenomatous polyp of colon    2005 TUBULAR ADENOMA  . History of herpes genitalis   . History of kidney stones   . Hypogonadism, testicular   . Lower urinary tract symptoms (LUTS)   . Nephrolithiasis    multiple right side non-obstructive per ct 11-27-2015    . Peyronie's disease   . Right ureteral stone    Past Surgical History:  Procedure Laterality Date  . ABDOMINAL HERNIA REPAIR  02/2014  . CATARACT EXTRACTION    . COLONOSCOPY WITH PROPOFOL N/A 06/06/2015   Procedure: COLONOSCOPY WITH PROPOFOL;  Surgeon: [name], MD;  Location: [facility];  Service: Endoscopy;  Laterality: N/A;  . CYSTOSCOPY WITH RETROGRADE PYELOGRAM, URETEROSCOPY AND STENT PLACEMENT Right 11/30/2015   Procedure: CYSTOSCOPY/RETROGRADE/URETEROSCOPY/;  Surgeon: [name], MD;  Location: [facility];  Service: Urology;  Laterality: Right;  . CYSTOSCOPY/URETEROSCOPY/HOLMIUM LASER/STENT PLACEMENT Right 12/20/2015   Procedure: CYSTOSCOPY/URETEROSCOPY/HOLMIUM LASER/ REMOVE RIGHT JJ STENT;  Surgeon: [name], MD;  Location[facility];  Service: Urology;  Laterality: Right;  1 HOUR  . CYSTOSCOPY/URETEROSCOPY/HOLMIUM LASER/STENT PLACEMENT Right 01/13/2018   Procedure: CYSTOSCOPY/URETEROSCOPY/HOLMIUM LASER/STONE REMOVAL/STENT PLACEMENT;  Surgeon: [name], MD;  Location: [facility];  Service: Urology;  Laterality: Right;  . INGUINAL HERNIA REPAIR Left 2001  . KIDNEY STONE SURGERY     x2 times  . KNEE ARTHROSCOPY Left 2005  . NESBIT PLICATION FOR PEYRONIE'S DISEASE  05/01/2013   and Right Inguinal Hernia Repair  . TONSILLECTOMY  child    Social History   Socioeconomic History  . Marital status: Married    Spouse name: Not on file  . Number of children: Not on file  . Years of education: Not on file  . Highest education level: Not on file  Occupational History  . Not on file  Tobacco Use  . Smoking status: Never Smoker  . Smokeless tobacco: Never Used  Vaping Use  . Vaping Use: Never used  Substance and Sexual Activity  . Alcohol use: Yes    Alcohol/week: 4.0 standard drinks    Types: 4 Cans of beer per week  . Drug use: No  . Sexual activity: Yes    Birth control/protection: None  Other Topics Concern  . Not  on file  Social History Narrative   Diet:  No      Do you drink/ eat things with caffeine? No      Marital status:    Married                           What year were you married ? 2015      Do you live in a house, apartment,assistred living, condo, trailer, etc.)?  House      Is it one or more stories? 2 Stories      How many persons live in your home ? 2      Do you have any pets in your home ?(please list) 1 Dog, 1 Cat      Highest Level of education completed: [employer] , [place]  1970      Current or past profession:  Retired [employer] you exercise?   Yes                           Type & how often  [employer]- 2/3 times a week; Exercise Class Yoga- 2 Times a week, Water Aerobics = when pool is open      ADVANCED DIRECTIVES (Please bring copies)      Do you have a living will? Yes      Do you have a DNR form?      Yes                 If not, do you want to discuss one?       Do you have signed POA?HPOA forms?   Yes              If so, please bring to your appointment      FUNCTIONAL STATUS- To be completed by Spouse / child / Staff       Do you have difficulty bathing or dressing yourself ?  No      Do you have difficulty preparing food or eating ?  No      Do you have difficulty managing your mediation ?  No      Do you have difficulty managing your finances ?  No      Do you have difficulty affording your medication ?  No      Social Determinants of Health   Financial Resource Strain:   . Difficulty of Paying Living Expenses: Not on file  Food Insecurity:   . Worried About Charity fundraiser in the Last Year: Not on file  . Ran Out of Food in the Last Year: Not on file  Transportation Needs:   . Lack of Transportation (Medical): Not on file  . Lack of Transportation (Non-Medical): Not on file  Physical Activity:   . Days of Exercise per Week: Not on file  . Minutes of Exercise per Session: Not on file  Stress:   . Feeling of Stress  : Not on file  Social Connections:   . Frequency of Communication with Friends and Family: Not on file  . Frequency of Social Gatherings with Friends and Family: Not on file  . Attends Religious Services: Not on file  . Active Member of Clubs or Organizations: Not on file  . Attends Archivist Meetings: Not on file  . Marital Status: Not on file    reports that he has never smoked. He has never used smokeless tobacco. He reports current alcohol use of about 4.0 standard drinks of alcohol per week. He reports that he does not use drugs.  Functional Status Survey:    Family History  Problem Relation Age of Onset  . Brain cancer Mother 69  . Cancer Sister 70       breast  . Hypertension Paternal Grandmother   . Depression Paternal Grandmother        suicide    Health Maintenance  Topic Date Due  . INFLUENZA VACCINE  11/14/2019  . Hepatitis C Screening  11/26/2028 (Originally [birth year])  . TETANUS/TDAP  04/11/2029  . COVID-19 Vaccine  Completed  . PNA vac Low Risk Adult  Discontinued    No Known Allergies  Outpatient Encounter Medications as of 12/16/2019  Medication Sig  .  ascorbic acid (VITAMIN C) 500 MG tablet Take 500 mg by mouth daily.  . Cholecalciferol (VITAMIN D-3) 5000 units TABS Take 1 tablet by mouth every morning.  [name] co-enzyme Q-10 30 MG capsule Take 30 mg by mouth daily.   . ferrous sulfate 325 (65 FE) MG tablet Take 325 mg by mouth 3 (three) times a week.   . fexofenadine (ALLEGRA) 180 MG tablet Take 180 mg by mouth daily.   . Ginkgo Biloba 40 MG TABS Take by mouth. Takes 60 mg daily  . Lactobacillus (PROBIOTIC ACIDOPHILUS PO) Take 1 capsule by mouth daily.   . Multiple Vitamin (MULTIVITAMIN WITH MINERALS) TABS tablet Take 1 tablet by mouth daily.  . Omega-3 Fatty Acids (OMEGA 3 PO) Take 1 tablet by mouth every morning.  . tamsulosin (FLOMAX) 0.4 MG CAPS capsule Take 0.4 mg by mouth every morning.  . Testosterone 10 MG/ACT (2%) GEL Apply 1 application  topically daily.  . Turmeric 450 MG CAPS Take 1 capsule by mouth daily.  . vitamin B-12 (CYANOCOBALAMIN) 100 MCG tablet Take 100 mcg by mouth daily.  . [DISCONTINUED] B Complex Vitamins (B COMPLEX PO) Take by mouth.  . [DISCONTINUED] doxycycline (VIBRAMYCIN) 100 MG capsule Take 1 capsule (100 mg total) by mouth 2 (two) times daily.  . [DISCONTINUED] Menaquinone-7 (VITAMIN K2) 100 MCG CAPS Take by mouth.  . [DISCONTINUED] OVER THE COUNTER MEDICATION   . [DISCONTINUED] vitamin A 10000 UNIT capsule Take 10,000 Units by mouth daily.   No facility-administered encounter medications on file as of 12/16/2019.    Review of Systems  Constitutional: Negative for chills, fever and weight loss.  HENT: Positive for hearing loss and tinnitus. Negative for sore throat.   Eyes:       Corrective lenses; cataracts  Respiratory: Negative for cough and shortness of breath.   Cardiovascular: Negative for chest pain, palpitations and leg swelling.  Gastrointestinal: Positive for constipation.  Genitourinary:       Weak stream, kidney stones  Musculoskeletal: Positive for back pain and joint pain.  Skin: Positive for itching and rash.  Neurological: Negative for dizziness and loss of consciousness.  Endo/Heme/Allergies: Positive for environmental allergies.       Herpes  Psychiatric/Behavioral: Negative for depression and memory loss.    Vitals:   12/16/19 1322  BP: 118/62  Pulse: 72  Temp: (!) 97.3 F (36.3 C)  TempSrc: Temporal  SpO2: 97%  Weight: 156 lb 12.8 oz (71.1 kg)  Height: 5\' 9"  (1.753 m)   Body mass index is 23.16 kg/m. Physical Exam Vitals reviewed.  Constitutional:      General: He is not in acute distress.    Appearance: Normal appearance. He is not toxic-appearing.  HENT:     Head: Normocephalic and atraumatic.     Right Ear: Tympanic membrane, ear canal and external ear normal.     Left Ear: Tympanic membrane, ear canal and external ear normal.     Ears:     Comments:  Small amount of cerumen in each ear  Eyes:     Pupils: Pupils are equal, round, and reactive to light.  Cardiovascular:     Rate and Rhythm: Normal rate and regular rhythm.     Pulses: Normal pulses.     Heart sounds: Normal heart sounds.  Pulmonary:     Effort: Pulmonary effort is normal.     Breath sounds: Normal breath sounds.  Abdominal:     General: Bowel sounds are normal.     Palpations:  Abdomen is soft.  Musculoskeletal:        General: Normal range of motion.  Skin:    General: Skin is warm and dry.  Neurological:     General: No focal deficit present.     Mental Status: He is alert and oriented to person, place, and time.  Psychiatric:        Mood and Affect: Mood normal.        Behavior: Behavior normal.        Thought Content: Thought content normal.        Judgment: Judgment normal.     Labs reviewed: Basic Metabolic Panel: Recent Labs    04/12/19 0952 09/23/19 1601  NA 144 147*  K 4.9 4.2  CL 104 105  CO2 28 28  GLUCOSE 98 97  BUN 17 21  CREATININE 1.18 1.30*  CALCIUM 9.6 9.4   Liver Function Tests: Recent Labs    04/12/19 0952 09/23/19 1601  AST 20 26  ALT 17 21  ALKPHOS 60 60  BILITOT 0.5 0.3  PROT 6.6 6.8  ALBUMIN 4.2 4.5   No results for input(s): LIPASE, AMYLASE in the last 8760 hours. No results for input(s): AMMONIA in the last 8760 hours. CBC: Recent Labs    04/12/19 0952 09/23/19 1601  WBC 5.2 5.5  NEUTROABS 2.7 3.0  HGB 15.3 14.8  HCT Jesus.7 42.5  MCV 95 95  PLT 254 320   Cardiac Enzymes: No results for input(s): CKTOTAL, CKMB, CKMBINDEX, TROPONINI in the last 8760 hours. BNP: Invalid input(s): POCBNP Lab Results  Component Value Date   HGBA1C 5.9 (H) 04/12/2019   Lab Results  Component Value Date   TSH 1.120 04/12/2019   Lab Results  Component Value Date   VITAMINB12 742 12/25/2017    Assessment/Plan 1. Generalized OA - neck, knees, back - continue occasional aleve, discussed tylenol, voltaren gel, staying  active like he is - COMPLETE METABOLIC PANEL WITH GFR; Future  2. Stage 3a chronic kidney disease - Avoid nephrotoxic agents like nsaids, dose adjust renally excreted meds, hydrate. - CBC with Differential/Platelet; Future - COMPLETE METABOLIC PANEL WITH GFR; Future  3. Benign prostatic hyperplasia, unspecified whether lower urinary tract symptoms present -continue tamsulosin therapy  4. Vitamin D deficiency -continue D3  5. History of chronic constipation -not recently problematic with fiber supplement and adequate veggies and hydration, staying active  6. Cervicalgia -not an active complaint, but has had this, cont prn tylenol or aleve rarely, may use voltaren gel, heat, ice  7. DDD (degenerative disc disease), lumbar - continue conservative therapy and stretching  8. Hyperglycemia - continue current improved diet -stay active  -Hemoglobin A1c; Future - COMPLETE METABOLIC PANEL WITH GFR; Future  9. Screening, lipid -check cholesterol - Lipid panel; Future  10. Need for influenza vaccination - Flu Vaccine QUAD High Dose(Fluad) given  Labs/tests ordered:   Lab Orders     Hemoglobin A1c     CBC with Differential/Platelet     COMPLETE METABOLIC PANEL WITH GFR     Lipid panel  CPE in 4 mos with fasting labs before  Thaddaeus Granja L. Shauntee Karp, D.O. [facility] [address], [place] [address] Cell Phone (Mon-Fri 8am-5pm):  [phone] On Call:  [phone] & follow prompts after 5pm & weekends Office Phone:  [phone] Office Fax:  [phone]

## 2020-02-09 DIAGNOSIS — B079 Viral wart, unspecified: Secondary | ICD-10-CM | POA: Diagnosis not present

## 2020-02-09 DIAGNOSIS — L570 Actinic keratosis: Secondary | ICD-10-CM | POA: Diagnosis not present

## 2020-02-09 DIAGNOSIS — D485 Neoplasm of uncertain behavior of skin: Secondary | ICD-10-CM | POA: Diagnosis not present

## 2020-03-13 ENCOUNTER — Other Ambulatory Visit: Payer: Federal, State, Local not specified - PPO

## 2020-03-16 ENCOUNTER — Encounter: Payer: Federal, State, Local not specified - PPO | Admitting: Physician Assistant

## 2020-04-11 ENCOUNTER — Other Ambulatory Visit: Payer: Federal, State, Local not specified - PPO

## 2020-04-11 ENCOUNTER — Other Ambulatory Visit: Payer: Self-pay

## 2020-04-11 DIAGNOSIS — R739 Hyperglycemia, unspecified: Secondary | ICD-10-CM | POA: Diagnosis not present

## 2020-04-11 DIAGNOSIS — Z13220 Encounter for screening for lipoid disorders: Secondary | ICD-10-CM | POA: Diagnosis not present

## 2020-04-11 DIAGNOSIS — M159 Polyosteoarthritis, unspecified: Secondary | ICD-10-CM

## 2020-04-11 DIAGNOSIS — N1831 Chronic kidney disease, stage 3a: Secondary | ICD-10-CM | POA: Diagnosis not present

## 2020-04-12 LAB — LIPID PANEL
Cholesterol: 159 mg/dL (ref <200)
HDL: 57 mg/dL (ref >=40)
LDL Cholesterol (Calc): 88 mg/dL (calc)
Non-HDL Cholesterol (Calc): 102 mg/dL (calc) (ref <130)
Total CHOL/HDL Ratio: 2.8 (calc) (ref <5.0)
Triglycerides: 62 mg/dL (ref <150)

## 2020-04-12 LAB — CBC WITH DIFFERENTIAL/PLATELET
Absolute Monocytes: 515 cells/uL (ref 200–950)
Basophils Absolute: 60 cells/uL (ref 0–200)
Basophils Relative: 1.3 %
Eosinophils Absolute: 202 cells/uL (ref 15–500)
Eosinophils Relative: 4.4 %
HCT: 44.1 % (ref 38.5–50.0)
Hemoglobin: 15.1 g/dL (ref 13.2–17.1)
Lymphs Abs: 1481 cells/uL (ref 850–3900)
MCH: 33.4 pg — ABNORMAL HIGH (ref 27.0–33.0)
MCHC: 34.2 g/dL (ref 32.0–36.0)
MCV: 97.6 fL (ref 80.0–100.0)
MPV: 9.8 fL (ref 7.5–12.5)
Monocytes Relative: 11.2 %
Neutro Abs: 2341 cells/uL (ref 1500–7800)
Neutrophils Relative %: 50.9 %
Platelets: 238 10*3/uL (ref 140–400)
RBC: 4.52 10*6/uL (ref 4.20–5.80)
RDW: 11.9 % (ref 11.0–15.0)
Total Lymphocyte: 32.2 %
WBC: 4.6 10*3/uL (ref 3.8–10.8)

## 2020-04-12 LAB — COMPLETE METABOLIC PANEL WITH GFR
AG Ratio: 2 (calc) (ref 1.0–2.5)
ALT: 17 U/L (ref 9–46)
AST: 19 U/L (ref 10–35)
Albumin: 4.1 g/dL (ref 3.6–5.1)
Alkaline phosphatase (APISO): 48 U/L (ref 35–144)
BUN/Creatinine Ratio: 14 (calc) (ref 6–22)
BUN: 19 mg/dL (ref 7–25)
CO2: 31 mmol/L (ref 20–32)
Calcium: 9.1 mg/dL (ref 8.6–10.3)
Chloride: 105 mmol/L (ref 98–110)
Creat: 1.32 mg/dL — ABNORMAL HIGH (ref 0.70–1.18)
GFR, Est African American: 60 mL/min/{1.73_m2} (ref >=60)
GFR, Est Non African American: 52 mL/min/{1.73_m2} — ABNORMAL LOW (ref >=60)
Globulin: 2.1 g/dL (calc) (ref 1.9–3.7)
Glucose, Bld: 93 mg/dL (ref 65–99)
Potassium: 4.3 mmol/L (ref 3.5–5.3)
Sodium: 141 mmol/L (ref 135–146)
Total Bilirubin: 0.6 mg/dL (ref 0.2–1.2)
Total Protein: 6.2 g/dL (ref 6.1–8.1)

## 2020-04-12 LAB — HEMOGLOBIN A1C
Hgb A1c MFr Bld: 5.8 % of total Hgb — ABNORMAL HIGH (ref <5.7)
Mean Plasma Glucose: 120 mg/dL
eAG (mmol/L): 6.6 mmol/L

## 2020-04-17 DIAGNOSIS — M542 Cervicalgia: Secondary | ICD-10-CM | POA: Diagnosis not present

## 2020-04-17 DIAGNOSIS — M7582 Other shoulder lesions, left shoulder: Secondary | ICD-10-CM | POA: Diagnosis not present

## 2020-04-17 DIAGNOSIS — M19012 Primary osteoarthritis, left shoulder: Secondary | ICD-10-CM | POA: Diagnosis not present

## 2020-04-20 ENCOUNTER — Encounter: Payer: Self-pay | Admitting: Internal Medicine

## 2020-04-20 ENCOUNTER — Ambulatory Visit (INDEPENDENT_AMBULATORY_CARE_PROVIDER_SITE_OTHER): Payer: Federal, State, Local not specified - PPO | Admitting: Internal Medicine

## 2020-04-20 ENCOUNTER — Other Ambulatory Visit: Payer: Self-pay

## 2020-04-20 VITALS — BP 118/68 | HR 68 | Temp 97.7°F | Ht 69.0 in | Wt 158.2 lb

## 2020-04-20 DIAGNOSIS — M5136 Other intervertebral disc degeneration, lumbar region: Secondary | ICD-10-CM | POA: Diagnosis not present

## 2020-04-20 DIAGNOSIS — R739 Hyperglycemia, unspecified: Secondary | ICD-10-CM

## 2020-04-20 DIAGNOSIS — N1831 Chronic kidney disease, stage 3a: Secondary | ICD-10-CM

## 2020-04-20 DIAGNOSIS — M159 Polyosteoarthritis, unspecified: Secondary | ICD-10-CM

## 2020-04-20 DIAGNOSIS — Z0000 Encounter for general adult medical examination without abnormal findings: Secondary | ICD-10-CM

## 2020-04-20 DIAGNOSIS — S46012A Strain of muscle(s) and tendon(s) of the rotator cuff of left shoulder, initial encounter: Secondary | ICD-10-CM

## 2020-04-20 NOTE — Progress Notes (Signed)
Provider:  Gwenith Spitz. Renato Gails, D.O., C.M.D. Location:   [facility]   Place of Service:   clinic  Previous PCP: Kermit Balo, DO Patient Care Team: Kermit Balo, DO as PCP - General (Geriatric Medicine) Specialists, [facility] (Orthopedic Surgery) Cherlyn Roberts, MD as Consulting Physician (Dermatology) Charolett Bumpers, MD as Consulting Physician (Gastroenterology) Marcine Matar, MD as Consulting Physician (Urology) Mia Creek, MD as Consulting Physician (Ophthalmology) Cherlyn Roberts, MD as Referring Physician (Dermatology) Pati Gallo, MD as Consulting Physician (Sports Medicine)  Extended Emergency Contact Information Primary Emergency Contact: [facility] Address: [address]          [place], [place] [address] Darden Amber of [place] Home Phone: [phone] Relation: Spouse Secondary Emergency Contact: Xayden, Linsey Mobile Phone: [phone] Relation: Son Preferred language: English  Goals of Care: Advanced Directive information Advanced Directives 04/20/2020  Does Patient Have a Medical Advance Directive? Yes  Type of Estate agent of [place];Out of facility DNR (pink MOST or yellow form)  Does patient want to make changes to medical advance directive? No - Patient declined  Copy of Healthcare Power of Attorney in Chart? No - copy requested  Pre-existing out of facility DNR order (yellow form or pink MOST form) Pink MOST/Yellow Form most recent copy in chart - Physician notified to receive inpatient order  He will bring [place] copies of living will and [facility]  Chief Complaint  Patient presents with  . Annual Exam    Annual physical exam     HPI: Patient is a 75 y.o. male seen today for an annual physical exam.  Wonders if he has some wax in his ears.  Builds up.  Questions about labs:  Sugar average was a little better.  It's barely in the prediabetic range.  He admits to a sweet tooth.  Likes cookies, ice  cream, pie.  Can resist donuts.  He hopes to stay active, also.  Has good cholesterol results.    Getting some curling of his toes.  Wonders if they need intervention especially his second toes.  Starting to get hammertoes.    Has left rotator cuff problem and arthritis.  Does use advil, aleve fairly often and counseled tylenol better.  Does use topicals some and recommended that.    He was in [place] for a year and learned [place] 6 hrs a day for 47 weeks in [place] first.   Past Medical History:  Diagnosis Date  . Arthritis   . BPH (benign prostatic hyperplasia)   . ED (erectile dysfunction)   . Elevated PSA   . History of acute renal failure    08/ 2017  . History of adenomatous polyp of colon    2005 TUBULAR ADENOMA  . History of herpes genitalis   . History of kidney stones   . Hypogonadism, testicular   . Lower urinary tract symptoms (LUTS)   . Nephrolithiasis    multiple right side non-obstructive per ct 11-27-2015  . Peyronie's disease   . Right ureteral stone    Past Surgical History:  Procedure Laterality Date  . ABDOMINAL HERNIA REPAIR  02/2014  . CATARACT EXTRACTION    . COLONOSCOPY WITH PROPOFOL N/A 06/06/2015   Procedure: COLONOSCOPY WITH PROPOFOL;  Surgeon: Charolett Bumpers, MD;  Location: [facility];  Service: Endoscopy;  Laterality: N/A;  . CYSTOSCOPY WITH RETROGRADE PYELOGRAM, URETEROSCOPY AND STENT PLACEMENT Right 11/30/2015   Procedure: CYSTOSCOPY/RETROGRADE/URETEROSCOPY/;  Surgeon: Jerilee Field, MD;  Location: [facility];  Service: Urology;  Laterality: Right;  . CYSTOSCOPY/URETEROSCOPY/HOLMIUM LASER/STENT PLACEMENT Right 12/20/2015   Procedure: CYSTOSCOPY/URETEROSCOPY/HOLMIUM LASER/ REMOVE RIGHT JJ STENT;  Surgeon: [name], MD;  Location: [facility];  Service: Urology;  Laterality: Right;  1 HOUR  . CYSTOSCOPY/URETEROSCOPY/HOLMIUM LASER/STENT PLACEMENT Right 01/13/2018   Procedure: CYSTOSCOPY/URETEROSCOPY/HOLMIUM  LASER/STONE REMOVAL/STENT PLACEMENT;  Surgeon: [name], MD;  Location: [facility];  Service: Urology;  Laterality: Right;  . INGUINAL HERNIA REPAIR Left 2001  . KIDNEY STONE SURGERY     x2 times  . KNEE ARTHROSCOPY Left 2005  . NESBIT PLICATION FOR PEYRONIE'S DISEASE  05/01/2013   and Right Inguinal Hernia Repair  . TONSILLECTOMY  child    reports that he has never smoked. He has never used smokeless tobacco. He reports current alcohol use of about 4.0 standard drinks of alcohol per week. He reports that he does not use drugs.  Functional Status Survey:    Family History  Problem Relation Age of Onset  . Brain cancer Mother 37  . Cancer Sister 60       breast  . Hypertension Paternal Grandmother   . Depression Paternal Grandmother        suicide    Health Maintenance  Topic Date Due  . Hepatitis C Screening  11/26/2028 (Originally 1946/02/17)  . COVID-19 Vaccine (4 - Booster for Pfizer series) 06/17/2020  . TETANUS/TDAP  04/11/2029  . INFLUENZA VACCINE  Completed  . PNA vac Low Risk Adult  Discontinued    No Known Allergies  Outpatient Encounter Medications as of 04/20/2020  Medication Sig  . ascorbic acid (VITAMIN C) 500 MG tablet Take 500 mg by mouth daily.  . Cholecalciferol (VITAMIN D-3) 5000 units TABS Take 1 tablet by mouth every morning.  [name] co-enzyme Q-10 30 MG capsule Take 30 mg by mouth daily.   . ferrous sulfate 325 (65 FE) MG tablet Take 325 mg by mouth 3 (three) times a week.   . fexofenadine (ALLEGRA) 180 MG tablet Take 180 mg by mouth daily.   . Ginkgo Biloba 40 MG TABS Take by mouth. Takes 60 mg daily  . Lactobacillus (PROBIOTIC ACIDOPHILUS PO) Take 1 capsule by mouth daily.   . Multiple Vitamin (MULTIVITAMIN WITH MINERALS) TABS tablet Take 1 tablet by mouth daily.  . Omega-3 Fatty Acids (OMEGA 3 PO) Take 1 tablet by mouth every morning.  . tamsulosin (FLOMAX) 0.4 MG CAPS capsule Take 0.4 mg by mouth every morning.  . Testosterone 10 MG/ACT (2%) GEL  Apply 1 application topically daily.  . Turmeric 450 MG CAPS Take 1 capsule by mouth daily.  . vitamin B-12 (CYANOCOBALAMIN) 100 MCG tablet Take 100 mcg by mouth daily.   No facility-administered encounter medications on file as of 04/20/2020.    Review of Systems  Constitutional: Negative for chills, fever and malaise/fatigue.  HENT: Positive for hearing loss. Negative for congestion and sore throat.        Cerumen impaction  Eyes: Negative for blurred vision.  Respiratory: Negative for cough and shortness of breath.   Cardiovascular: Negative for chest pain, palpitations and leg swelling.  Gastrointestinal: Negative for abdominal pain, blood in stool, constipation and melena.  Genitourinary: Negative for dysuria.  Musculoskeletal: Positive for joint pain. Negative for back pain and falls.       Left shoulder  Skin: Negative for itching and rash.  Neurological: Negative for dizziness and loss of consciousness.  Psychiatric/Behavioral: Negative for depression and memory loss. The patient is not nervous/anxious and does not have insomnia.  Vitals:   04/20/20 1105  BP: 118/68  Pulse: 68  Temp: 97.7 F (36.5 C)  SpO2: 93%  Weight: 158 lb 3.2 oz (71.8 kg)  Height: 5\' 9"  (1.753 m)   Body mass index is 23.36 kg/m. Physical Exam Vitals reviewed.  Constitutional:      General: He is not in acute distress.    Appearance: Normal appearance. He is not ill-appearing or toxic-appearing.  HENT:     Head: Normocephalic and atraumatic.     Right Ear: External ear normal. There is impacted cerumen.     Left Ear: External ear normal. There is impacted cerumen.     Nose: Nose normal.     Mouth/Throat:     Pharynx: Oropharynx is clear.  Eyes:     Extraocular Movements: Extraocular movements intact.     Conjunctiva/sclera: Conjunctivae normal.     Pupils: Pupils are equal, round, and reactive to light.  Cardiovascular:     Rate and Rhythm: Normal rate and regular rhythm.     Pulses:  Normal pulses.     Heart sounds: Normal heart sounds.  Pulmonary:     Effort: Pulmonary effort is normal.     Breath sounds: Normal breath sounds. No wheezing, rhonchi or rales.  Abdominal:     General: Bowel sounds are normal.     Palpations: Abdomen is soft.     Tenderness: There is no abdominal tenderness. There is no guarding or rebound.  Musculoskeletal:        General: No tenderness.     Cervical back: Neck supple.     Right lower leg: No edema.     Left lower leg: No edema.     Comments: Left shoulder ROM limited; hammertoes developing on second toes of both feet  Lymphadenopathy:     Cervical: No cervical adenopathy.  Skin:    General: Skin is warm and dry.  Neurological:     General: No focal deficit present.     Mental Status: He is alert and oriented to person, place, and time.     Motor: No weakness.     Gait: Gait normal.  Psychiatric:        Mood and Affect: Mood normal.        Behavior: Behavior normal.        Thought Content: Thought content normal.        Judgment: Judgment normal.     Labs reviewed: Basic Metabolic Panel: Recent Labs    09/23/19 1601 04/11/20 0815  NA 147* 141  K 4.2 4.3  CL 105 105  CO2 28 31  GLUCOSE 97 93  BUN 21 19  CREATININE 1.30* 1.32*  CALCIUM 9.4 9.1   Liver Function Tests: Recent Labs    09/23/19 1601 04/11/20 0815  AST 26 19  ALT 21 17  ALKPHOS 60  --   BILITOT 0.3 0.6  PROT 6.8 6.2  ALBUMIN 4.5  --    No results for input(s): LIPASE, AMYLASE in the last 8760 hours. No results for input(s): AMMONIA in the last 8760 hours. CBC: Recent Labs    09/23/19 1601 04/11/20 0815  WBC 5.5 4.6  NEUTROABS 3.0 2,341  HGB 14.8 15.1  HCT 42.5 44.1  MCV 95 97.6  PLT 320 238   Cardiac Enzymes: No results for input(s): CKTOTAL, CKMB, CKMBINDEX, TROPONINI in the last 8760 hours. BNP: Invalid input(s): POCBNP Lab Results  Component Value Date   HGBA1C 5.8 (H) 04/11/2020   Lab Results  Component Value Date    TSH 1.120 04/12/2019   Lab Results  Component Value Date   [id number] 12/25/2017   No results found for: FOLATE No results found for: IRON, TIBC, FERRITIN   Assessment/Plan 1. Annual physical exam -performed today  2. Generalized OA -counseled on use of tylenol, voltaren and other topicals rather than aleve, motrin  3. Stage 3a chronic kidney disease (HCC) -Avoid nephrotoxic agents like nsaids, dose adjust renally excreted meds, hydrate.  4. DDD (degenerative disc disease), lumbar -did not complain of this today, uses tylenol and topicals  5. Traumatic incomplete tear of left rotator cuff, initial encounter -felt to be partial, wants to continue golfing and hopes he will not need any surgery -cont tylenol, topicals; had cortisone injection with ortho  6. Hyperglycemia -loves sweets, will continue to work on reducing them some to keep this from heading into diabetes and stay active  Labs/tests ordered:  No orders of the defined types were placed in this encounter.  Note that we noted he had cerumen impaction and he may need to come back for a nurse visit for flushing of ears.    Jasemine Nawaz L. Shawnae Leiva, D.O. [facility] [address], [place] [address] Cell Phone (Mon-Fri 8am-5pm):  [phone] On Call:  [phone] & follow prompts after 5pm & weekends Office Phone:  [phone] Office Fax:  [phone]

## 2020-04-25 DIAGNOSIS — E291 Testicular hypofunction: Secondary | ICD-10-CM | POA: Diagnosis not present

## 2020-04-25 DIAGNOSIS — R948 Abnormal results of function studies of other organs and systems: Secondary | ICD-10-CM | POA: Diagnosis not present

## 2020-04-26 ENCOUNTER — Ambulatory Visit (INDEPENDENT_AMBULATORY_CARE_PROVIDER_SITE_OTHER): Payer: Federal, State, Local not specified - PPO | Admitting: Family

## 2020-04-26 ENCOUNTER — Encounter: Payer: Self-pay | Admitting: Family

## 2020-04-26 ENCOUNTER — Other Ambulatory Visit: Payer: Self-pay

## 2020-04-26 VITALS — BP 122/70 | HR 89 | Temp 99.5°F | Resp 16 | Ht 69.0 in | Wt 158.2 lb

## 2020-04-26 DIAGNOSIS — R5081 Fever presenting with conditions classified elsewhere: Secondary | ICD-10-CM

## 2020-04-26 DIAGNOSIS — R059 Cough, unspecified: Secondary | ICD-10-CM

## 2020-04-26 DIAGNOSIS — H6123 Impacted cerumen, bilateral: Secondary | ICD-10-CM

## 2020-04-26 DIAGNOSIS — J069 Acute upper respiratory infection, unspecified: Secondary | ICD-10-CM | POA: Diagnosis not present

## 2020-04-26 LAB — POCT INFLUENZA A/B
Influenza A, POC: NEGATIVE
Influenza B, POC: NEGATIVE

## 2020-04-26 LAB — POCT RAPID STREP A (OFFICE): Rapid Strep A Screen: NEGATIVE

## 2020-04-26 MED ORDER — ZINC GLUCONATE 50 MG PO TABS: 50.0000 mg | ORAL_TABLET | Freq: Every day | ORAL | 0 refills | Status: AC

## 2020-04-26 MED ORDER — GUAIFENESIN-DM 100-10 MG/5ML PO SYRP: 5.0000 mL | ORAL_SOLUTION | ORAL | 0 refills | Status: DC | PRN

## 2020-04-26 NOTE — Progress Notes (Signed)
Provider: Francoise Chojnowski FNP-C  Gayland Curry, DO  Patient Care Team: Gayland Curry, DO as PCP - General (Geriatric Medicine) Specialists, [name] (Orthopedic Surgery) Druscilla Brownie, MD as Consulting Physician (Dermatology) [name], MD as Consulting Physician (Gastroenterology) Franchot Gallo, MD as Consulting Physician (Urology) Darleen Crocker, MD as Consulting Physician (Ophthalmology) Druscilla Brownie, MD as Referring Physician (Dermatology) Berle Mull, MD as Consulting Physician (Sports Medicine)  Extended Emergency Contact Information Primary Emergency Contact: [facility] Address: [address]          [place], [place] [address] Johnnette Litter of [place] Phone: [phone] Relation: Spouse Secondary Emergency Contact: Kristi, H[place] Mobile Phone: [phone] Relation: Son Preferred language: English  Code Status:  DNR Goals of care: Advanced Directive information Advanced Directives 04/20/2020  Does Patient Have a Medical Advance Directive? Yes  Type of Paramedic of [place];Out of facility DNR (pink MOST or yellow form)  Does patient want to make changes to medical advance directive? No - Patient declined  Copy of [place] in Chart? No - copy requested  Pre-existing out of facility DNR order (yellow form or pink MOST form) Pink MOST/Yellow Form most recent copy in chart - Physician notified to receive inpatient order     Chief Complaint  Patient presents with  . Acute Visit    Complains of fever of 100.6, headache, bad cough, fatigue, sore throat, and nasal congestion. Has had vaccines/booster.     HPI:  Pt is a 75 y.o. male seen today for an acute visit for evaluation of cough x 2 days,fever 100.6,headache,fatigue and nasal congestion.has been coughing up yellow phelm since last night.No shortness of breath ,chest pain,tightness or wheezing or palpitation. Has taken a  Aspirin for headache. Has used chloraseptic spray. Has not been around any person sick with COVID-19  He has completed his COVID-19 vaccine including his booster.   Past Medical History:  Diagnosis Date  . Arthritis   . BPH (benign prostatic hyperplasia)   . ED (erectile dysfunction)   . Elevated PSA   . History of acute renal failure    08/ 2017  . History of adenomatous polyp of colon    2005 TUBULAR ADENOMA  . History of herpes genitalis   . History of kidney stones   . Hypogonadism, testicular   . Lower urinary tract symptoms (LUTS)   . Nephrolithiasis    multiple right side non-obstructive per ct 11-27-2015  . Peyronie's disease   . Right ureteral stone    Past Surgical History:  Procedure Laterality Date  . ABDOMINAL HERNIA REPAIR  02/2014  . CATARACT EXTRACTION    . COLONOSCOPY WITH PROPOFOL N/A 06/06/2015   Procedure: COLONOSCOPY WITH PROPOFOL;  Surgeon: [name], MD;  [facility];  Service: Endoscopy;  Laterality: N/A;  . CYSTOSCOPY WITH RETROGRADE PYELOGRAM, URETEROSCOPY AND STENT PLACEMENT Right 11/30/2015   Procedure: CYSTOSCOPY/RETROGRADE/URETEROSCOPY/;  Surgeon: [name], MD;  [facility];  Service: Urology;  Laterality: Right;  . CYSTOSCOPY/URETEROSCOPY/HOLMIUM LASER/STENT PLACEMENT Right 12/20/2015   Procedure: CYSTOSCOPY/URETEROSCOPY/HOLMIUM LASER/ REMOVE RIGHT JJ STENT;  Surgeon: [name], MD;  Location[facility];  Service: Urology;  Laterality: Right;  1 HOUR  . CYSTOSCOPY/URETEROSCOPY/HOLMIUM LASER/STENT PLACEMENT Right 01/13/2018   Procedure: CYSTOSCOPY/URETEROSCOPY/HOLMIUM LASER/STONE REMOVAL/STENT PLACEMENT;  Surgeon: [name], MD;  [facility];  Service: Urology;  Laterality: Right;  . INGUINAL HERNIA REPAIR Left 2001  . KIDNEY STONE SURGERY     x2  times  . KNEE ARTHROSCOPY Left 2005  . NESBIT PLICATION FOR PEYRONIE'S DISEASE  05/01/2013   and Right Inguinal Hernia  Repair  . TONSILLECTOMY  child    No Known Allergies  Outpatient Encounter Medications as of 04/26/2020  Medication Sig  . ascorbic acid (VITAMIN C) 500 MG tablet Take 500 mg by mouth daily.  [name] guaiFENesin-dextromethorphan (ROBITUSSIN DM) 100-10 MG/5ML syrup Take 5 mLs by mouth every 4 (four) hours as needed for cough.  . zinc gluconate 50 MG tablet Take 1 tablet (50 mg total) by mouth daily for 14 days.  . Cholecalciferol (VITAMIN D-3) 5000 units TABS Take 1 tablet by mouth every morning.  [name] co-enzyme Q-10 30 MG capsule Take 30 mg by mouth daily.   . ferrous sulfate 325 (65 FE) MG tablet Take 325 mg by mouth 3 (three) times a week.   . fexofenadine (ALLEGRA) 180 MG tablet Take 180 mg by mouth daily.   . Ginkgo Biloba 40 MG TABS Take by mouth. Takes 60 mg daily  . Lactobacillus (PROBIOTIC ACIDOPHILUS PO) Take 1 capsule by mouth daily.   . Multiple Vitamin (MULTIVITAMIN WITH MINERALS) TABS tablet Take 1 tablet by mouth daily.  . Omega-3 Fatty Acids (OMEGA 3 PO) Take 1 tablet by mouth every morning.  . tamsulosin (FLOMAX) 0.4 MG CAPS capsule Take 0.4 mg by mouth every morning.  . Testosterone 10 MG/ACT (2%) GEL Apply 1 application topically daily.  . Turmeric 450 MG CAPS Take 1 capsule by mouth daily.  . vitamin B-12 (CYANOCOBALAMIN) 100 MCG tablet Take 100 mcg by mouth daily.   No facility-administered encounter medications on file as of 04/26/2020.    Review of Systems  Constitutional: Positive for chills, fatigue and fever. Negative for appetite change.  HENT: Positive for congestion and rhinorrhea. Negative for sinus pressure, sinus pain, sneezing and sore throat.        No loss of sense of taste or smell   Eyes: Negative for discharge, redness and itching.  Respiratory: Positive for cough. Negative for chest tightness, shortness of breath and wheezing.   Cardiovascular: Negative for chest pain, palpitations and leg swelling.  Gastrointestinal: Negative for abdominal distention,  abdominal pain, constipation, diarrhea, nausea and vomiting.  Genitourinary: Negative for difficulty urinating, dysuria, flank pain, frequency and urgency.  Musculoskeletal: Negative for arthralgias, gait problem and joint swelling.  Skin: Negative for color change, pallor and rash.  Neurological: Positive for headaches. Negative for dizziness, weakness, light-headedness and numbness.    Immunization History  Administered Date(s) Administered  . Fluad Quad(high Dose 65+) 12/24/2018, 12/16/2019  . Influenza, High Dose Seasonal PF 12/16/2013, 12/12/2015, 01/09/2017, 01/14/2018  . Influenza-Unspecified 03/12/2011, 01/09/2017  . PFIZER SARS-COV-2 Vaccination 05/05/2019, 05/26/2019, 12/19/2019  . Pneumococcal Conjugate-13 01/17/2009, 09/16/2013  . Pneumococcal Polysaccharide-23 09/16/2013  . Td 08/17/2008  . Tdap 04/12/2019  . Zoster 12/16/2013, 12/12/2015  . Zoster Recombinat (Shingrix) 04/12/2019, 09/14/2019   Pertinent  Health Maintenance Due  Topic Date Due  . INFLUENZA VACCINE  Completed  . PNA vac Low Risk Adult  Discontinued   Fall Risk  04/20/2020 12/16/2019 04/12/2019 04/02/2018 12/30/2017  Falls in the past year? 0 1 0 0 No  Number falls in past yr: 0 0 0 - -  Injury with Fall? 0 0 0 - -  Follow up - - Falls evaluation completed Falls evaluation completed -   Functional Status Survey:    Vitals:   04/26/20 1131  BP: 122/70  Pulse: 89  Resp: 16  Temp:  99.5 F (37.5 C)  SpO2: 98%  Weight: 158 lb 3.2 oz (71.8 kg)  Height: 5\' 9"  (1.753 m)   Body mass index is 23.36 kg/m. Physical Exam Vitals reviewed.  Constitutional:      General: He is not in acute distress.    Appearance: He is normal weight. He is not ill-appearing.  HENT:     Head: Normocephalic.     Right Ear: There is impacted cerumen.     Left Ear: There is impacted cerumen.     Ears:     Comments: Moderate amounts of cerumen lavaged bilaterally.small amounts removed with curette.tolareted procedure well.  TM clear.      Nose: Nose normal. No congestion or rhinorrhea.     Mouth/Throat:     Mouth: Mucous membranes are moist.     Pharynx: Oropharynx is clear. No oropharyngeal exudate or posterior oropharyngeal erythema.  Eyes:     General: No scleral icterus.       Right eye: No discharge.        Left eye: No discharge.     Extraocular Movements: Extraocular movements intact.     Conjunctiva/sclera: Conjunctivae normal.     Pupils: Pupils are equal, round, and reactive to light.  Cardiovascular:     Rate and Rhythm: Normal rate and regular rhythm.     Pulses: Normal pulses.     Heart sounds: Normal heart sounds. No murmur heard. No friction rub. No gallop.   Pulmonary:     Effort: Pulmonary effort is normal. No respiratory distress.     Breath sounds: Normal breath sounds. No wheezing, rhonchi or rales.  Chest:     Chest wall: No tenderness.  Abdominal:     General: Bowel sounds are normal. There is no distension.     Palpations: Abdomen is soft. There is no mass.     Tenderness: There is no abdominal tenderness. There is no right CVA tenderness, left CVA tenderness, guarding or rebound.  Musculoskeletal:        General: No swelling or tenderness. Normal range of motion.     Cervical back: Normal range of motion. No rigidity or tenderness.     Right lower leg: No edema.     Left lower leg: No edema.  Lymphadenopathy:     Cervical: No cervical adenopathy.  Skin:    General: Skin is warm and dry.     Coloration: Skin is not pale.     Findings: No bruising, erythema or lesion.  Neurological:     Mental Status: He is alert and oriented to person, place, and time.     Cranial Nerves: No cranial nerve deficit.     Motor: No weakness.     Gait: Gait normal.  Psychiatric:        Mood and Affect: Mood normal.        Behavior: Behavior normal.        Thought Content: Thought content normal.        Judgment: Judgment normal.    Labs reviewed: Recent Labs    09/23/19 1601  04/11/20 0815  NA 147* 141  K 4.2 4.3  CL 105 105  CO2 28 31  GLUCOSE 97 93  BUN 21 19  CREATININE 1.30* 1.32*  CALCIUM 9.4 9.1   Recent Labs    09/23/19 1601 04/11/20 0815  AST 26 19  ALT 21 17  ALKPHOS 60  --   BILITOT 0.3 0.6  PROT 6.8 6.2  ALBUMIN 4.5  --  Recent Labs    09/23/19 1601 04/11/20 0815  WBC 5.5 4.6  NEUTROABS 3.0 2,341  HGB 14.8 15.1  HCT 42.5 44.1  MCV 95 97.6  PLT 320 238   Lab Results  Component Value Date   TSH 1.120 04/12/2019   Lab Results  Component Value Date   HGBA1C 5.8 (H) 04/11/2020   Lab Results  Component Value Date   CHOL 159 04/11/2020   HDL 57 04/11/2020   LDLCALC 88 04/11/2020   TRIG 62 04/11/2020   CHOLHDL 2.8 04/11/2020    Significant Diagnostic Results in last 30 days:  No results found.  Assessment/Plan  1. Viral upper respiratory tract infection Afebrile.reports cough,fever,chills.sore throat ,nasal congestion and body aches.will rule out COVID-19 and Influenza. - SARS-COV-2 RNA,(COVID-19) QUAL NAAT - POC Influenza A/B results are negative  - POC Rapid Strep A is negative  - Discussed supportive treatment for now then notify provider if symptoms worsen or fail to improve. - Encouraged to increase fluid intake to 6- 8 glasses of water  - take zinc 50 mg daily and continue with Vit D and Vitamin C supplement x 14 days.  - zinc gluconate 50 MG tablet; Take 1 tablet (50 mg total) by mouth daily for 14 days.  Dispense: 14 tablet; Refill: 0  2. Fever in other diseases Temp 99.5 reports 100.6 at home.took an [place] to take OTC Tylenol every 6-8 hrs as needed for body aches or fever. - supportive care as above.  - SARS-COV-2 RNA,(COVID-19) QUAL NAAT aware results returns in about 3 days then will call with results.  - POC Influenza A/B - POC Rapid Strep A - zinc gluconate 50 MG tablet; Take 1 tablet (50 mg total) by mouth daily for 14 days.  Dispense: 14 tablet; Refill: 0  3. Cough Both lungs clear  to Auscultation. - Take Robitussin DM as below.  - zinc gluconate 50 MG tablet; Take 1 tablet (50 mg total) by mouth daily for 14 days.  Dispense: 14 tablet; Refill: 0 - guaiFENesin-dextromethorphan (ROBITUSSIN DM) 100-10 MG/5ML syrup; Take 5 mLs by mouth every 4 (four) hours as needed for cough.  Dispense: 118 mL; Refill: 0  4. Bilateral impacted cerumen Moderate amounts of cerumen lavaged bilaterally.small amounts removed with curette.tolareted procedure well. TM clear.    Family/ staff Communication: Reviewed plan of care with patient verbalized understanding.   Labs/tests ordered:  - SARS-COV-2 RNA,(COVID-19) QUAL NAAT - POC Influenza A/B results are negative  - POC Rapid Strep A is negative   Next Appointment: As needed if symptoms worsen or fail to  Improve.   Sandrea Hughs, NP

## 2020-04-26 NOTE — Patient Instructions (Signed)
-   Take Vitamin D 5000 units one by mouth daily x 14 days to boost your immunity  -Vitamin 500 mg tablet one by mouth twice daily x 14 days  -Zinc 50 mg tablet one by mouth daily x 14 days  - Robitussin DM Take 5 mls by mouth every 4 hours as needed for cough

## 2020-04-28 ENCOUNTER — Telehealth: Payer: Self-pay

## 2020-04-28 LAB — SARS-COV-2 RNA,(COVID-19) QUALITATIVE NAAT: SARS CoV2 RNA: NOT DETECTED

## 2020-04-28 NOTE — Telephone Encounter (Signed)
Patient called to see if his results from his Covid Test where back yet I told him no that they have been taking a little longer because of the amount of test being done daily I suggested My Chart as an alternat way to check over the weekend to see if it had resulted

## 2020-05-02 DIAGNOSIS — N401 Enlarged prostate with lower urinary tract symptoms: Secondary | ICD-10-CM | POA: Diagnosis not present

## 2020-05-02 DIAGNOSIS — E291 Testicular hypofunction: Secondary | ICD-10-CM | POA: Diagnosis not present

## 2020-05-02 DIAGNOSIS — Z87442 Personal history of urinary calculi: Secondary | ICD-10-CM | POA: Diagnosis not present

## 2020-05-02 DIAGNOSIS — R3915 Urgency of urination: Secondary | ICD-10-CM | POA: Diagnosis not present

## 2020-06-05 ENCOUNTER — Encounter: Payer: Self-pay | Admitting: Internal Medicine

## 2020-07-31 ENCOUNTER — Ambulatory Visit
Admission: EM | Admit: 2020-07-31 | Discharge: 2020-07-31 | Disposition: A | Discharge status: Home / Self Care | Payer: Federal, State, Local not specified - PPO | Attending: Family Medicine | Admitting: Family Medicine

## 2020-07-31 ENCOUNTER — Telehealth: Payer: Self-pay | Admitting: Family Medicine

## 2020-07-31 ENCOUNTER — Other Ambulatory Visit: Payer: Self-pay

## 2020-07-31 DIAGNOSIS — H1033 Unspecified acute conjunctivitis, bilateral: Secondary | ICD-10-CM | POA: Diagnosis not present

## 2020-07-31 DIAGNOSIS — S20469A Insect bite (nonvenomous) of unspecified back wall of thorax, initial encounter: Secondary | ICD-10-CM | POA: Diagnosis not present

## 2020-07-31 DIAGNOSIS — Y929 Unspecified place or not applicable: Secondary | ICD-10-CM | POA: Insufficient documentation

## 2020-07-31 DIAGNOSIS — B9789 Other viral agents as the cause of diseases classified elsewhere: Secondary | ICD-10-CM | POA: Insufficient documentation

## 2020-07-31 DIAGNOSIS — J028 Acute pharyngitis due to other specified organisms: Secondary | ICD-10-CM | POA: Insufficient documentation

## 2020-07-31 DIAGNOSIS — Y939 Activity, unspecified: Secondary | ICD-10-CM | POA: Insufficient documentation

## 2020-07-31 DIAGNOSIS — W57XXXA Bitten or stung by nonvenomous insect and other nonvenomous arthropods, initial encounter: Secondary | ICD-10-CM

## 2020-07-31 DIAGNOSIS — J029 Acute pharyngitis, unspecified: Secondary | ICD-10-CM | POA: Diagnosis not present

## 2020-07-31 DIAGNOSIS — R490 Dysphonia: Secondary | ICD-10-CM | POA: Diagnosis not present

## 2020-07-31 DIAGNOSIS — R238 Other skin changes: Secondary | ICD-10-CM | POA: Diagnosis not present

## 2020-07-31 DIAGNOSIS — B302 Viral pharyngoconjunctivitis: Secondary | ICD-10-CM | POA: Diagnosis not present

## 2020-07-31 LAB — POCT RAPID STREP A (OFFICE): Rapid Strep A Screen: NEGATIVE

## 2020-07-31 MED ORDER — PREDNISONE 20 MG PO TABS: 40.0000 mg | ORAL_TABLET | Freq: Every day | ORAL | 0 refills | Status: DC

## 2020-07-31 MED ORDER — TRIAMCINOLONE ACETONIDE 0.1 % EX CREA: 1.0000 "application " | TOPICAL_CREAM | Freq: Two times a day (BID) | CUTANEOUS | 0 refills | Status: DC

## 2020-07-31 MED ORDER — ERYTHROMYCIN 5 MG/GM OP OINT
TOPICAL_OINTMENT | OPHTHALMIC | 0 refills | Status: DC
Start: 2020-07-31 — End: 2021-04-24

## 2020-07-31 NOTE — Discharge Instructions (Signed)
You are being treated for conjunctivitis , apply erythromycin eye ointment to lower eyelid at bedtime for 5 days.  Apply warm compresses to remove any drainage from eye. Continue daily allergy medicine. For viral pharyngitis, start prednisone 40 mg once daily with breakfast and take over the next 5 days to relieve the inflammation of your throat.  Also gargle with warm salt water to reduce throat irritation.

## 2020-07-31 NOTE — ED Triage Notes (Signed)
Pt presents with c/o mild sore throat and hoarseness that began last night, too home covid test and is negative

## 2020-07-31 NOTE — ED Triage Notes (Signed)
Pt also has red irritated eyes with drainage

## 2020-07-31 NOTE — Telephone Encounter (Signed)
patient entered office advising of cold and flu symptoms- advised that we do not have walk-in appointments for COVID symptoms, patient will need a negative Covid test to be scheduled- provided number to [facility] [phone]

## 2020-07-31 NOTE — ED Provider Notes (Signed)
[facility]    CSN: [id number] Arrival date & time: 07/31/20  0931      History   Chief Complaint Chief Complaint  Patient presents with  . Sore Throat  . Hoarse    HPI Jesus [name] is a 75 y.o. male.   HPI  Patient presents today with red eye irritation, soreness of throat, hoarseness of voice.  Symptoms again last night.  Patient took a COVID test which was negative. He also is experiencing a cough which is nonproductive. Taken otc medication without relief. Wife recently treated for bronchitis, however no other infectious source identified. Throat soreness is most worrisome symptom.   Past Medical History:  Diagnosis Date  . Arthritis   . BPH (benign prostatic hyperplasia)   . ED (erectile dysfunction)   . Elevated PSA   . History of acute renal failure    08/ 2017  . History of adenomatous polyp of colon    2005 TUBULAR ADENOMA  . History of herpes genitalis   . History of kidney stones   . Hypogonadism, testicular   . Lower urinary tract symptoms (LUTS)   . Nephrolithiasis    multiple right side non-obstructive per ct 11-27-2015  . Peyronie's disease   . Right ureteral stone     Patient Active Problem List   Diagnosis Date Noted  . Need for shingles vaccine 04/12/2019  . Need for Tdap vaccination 04/12/2019  . Osteoarthritis of knee 02/04/2019  . Impacted cerumen 02/04/2019  . Decreased hearing, left 11/20/2018  . History of chronic constipation 11/20/2018  . Iron deficiency anemia 12/30/2017  . h/o B12 deficiency 12/30/2017  . Muscle spasms- b/l upper traps 09/18/2017  . Tingling in extremities 09/12/2017  . 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 Neuropathy of forearm, right 09/12/2017  . Arthropathy of cervical facet joint 09/12/2017  . Vitamin D deficiency 06/26/2017  . Plantar fasciitis of left foot 03/26/2017  . Glucose intolerance (impaired glucose tolerance) 12/12/2016  . CKD (chronic kidney disease), stage III ([place])  12/12/2016  . 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 Elevated PSA 11/28/2016  . Generalized OA 11/26/2016  . BPH (benign prostatic hyperplasia) 11/26/2016  . Environmental and seasonal allergies 11/26/2016  . h/o Inguinal hernia- b/l and abd hernia repaired with mesh 11/26/2016  . S/P colonoscopy 11/26/2016  . h/o Renal stones- ca oxalate.  11/26/2016  . Cervicalgia 11/26/2016  . Peripheral neuropathy 11/26/2016    Past Surgical History:  Procedure Laterality Date  . ABDOMINAL HERNIA REPAIR  02/2014  . CATARACT EXTRACTION    . COLONOSCOPY WITH PROPOFOL N/A 06/06/2015   Procedure: COLONOSCOPY WITH PROPOFOL;  Surgeon: [name], MD;  Location: [facility];  Service: Endoscopy;  Laterality: N/A;  . CYSTOSCOPY WITH RETROGRADE PYELOGRAM, URETEROSCOPY AND STENT PLACEMENT Right 11/30/2015   Procedure: CYSTOSCOPY/RETROGRADE/URETEROSCOPY/;  Surgeon: [name], MD;  Location: [facility];  Service: Urology;  Laterality: Right;  . CYSTOSCOPY/URETEROSCOPY/HOLMIUM LASER/STENT PLACEMENT Right 12/20/2015   Procedure: CYSTOSCOPY/URETEROSCOPY/HOLMIUM LASER/ REMOVE RIGHT JJ STENT;  Surgeon: [name], MD;  Location: [facility];  Service: Urology;  Laterality: Right;  1 HOUR  . CYSTOSCOPY/URETEROSCOPY/HOLMIUM LASER/STENT PLACEMENT Right 01/13/2018   Procedure: CYSTOSCOPY/URETEROSCOPY/HOLMIUM LASER/STONE REMOVAL/STENT PLACEMENT;  Surgeon: [name], MD;  Location: [facility];  Service: Urology;  Laterality: Right;  . INGUINAL HERNIA REPAIR Left 2001  . KIDNEY STONE SURGERY     x2 times  . KNEE ARTHROSCOPY Left 2005  . NESBIT PLICATION FOR PEYRONIE'S DISEASE  05/01/2013   and Right Inguinal Hernia Repair  . TONSILLECTOMY  child       Home Medications    Prior to Admission medications   Medication Sig Start Date End Date Taking? Authorizing Provider  erythromycin ophthalmic ointment Place a 1/2 inch ribbon of ointment into the lower eyelid x 5 days  07/31/20  Yes Scot Jun, FNP  predniSONE (DELTASONE) 20 MG tablet Take 2 tablets (40 mg total) by mouth daily with breakfast. 07/31/20  Yes Scot Jun, FNP  triamcinolone cream (KENALOG) 0.1 % Apply 1 application topically 2 (two) times daily. Use for 7 days then as needed for insect bites 07/31/20  Yes Scot Jun, FNP  ascorbic acid (VITAMIN C) 500 MG tablet Take 500 mg by mouth daily.    [provider]  Cholecalciferol (VITAMIN D-3) 5000 units TABS Take 1 tablet by mouth every morning.    [provider]  co-enzyme Q-10 30 MG capsule Take 30 mg by mouth daily.     [provider]  ferrous sulfate 325 (65 FE) MG tablet Take 325 mg by mouth 3 (three) times a week.     [provider]  fexofenadine (ALLEGRA) 180 MG tablet Take 180 mg by mouth daily.     [provider]  Ginkgo Biloba 40 MG TABS Take by mouth. Takes 60 mg daily    [provider]  guaiFENesin-dextromethorphan (ROBITUSSIN DM) 100-10 MG/5ML syrup Take 5 mLs by mouth every 4 (four) hours as needed for cough. 04/26/20   Ngetich, Dinah C, NP  Lactobacillus (PROBIOTIC ACIDOPHILUS PO) Take 1 capsule by mouth daily.     [provider]  Multiple Vitamin (MULTIVITAMIN WITH MINERALS) TABS tablet Take 1 tablet by mouth daily.    [provider]  Omega-3 Fatty Acids (OMEGA 3 PO) Take 1 tablet by mouth every morning.    [provider]  tamsulosin (FLOMAX) 0.4 MG CAPS capsule Take 0.4 mg by mouth every morning. 05/13/15   [provider]  Testosterone 10 MG/ACT (2%) GEL Apply 1 application topically daily. 12/19/16   [provider]  Turmeric 450 MG CAPS Take 1 capsule by mouth daily.    [provider]  vitamin B-12 (CYANOCOBALAMIN) 100 MCG tablet Take 100 mcg by mouth daily.    [provider]    Family History Family History  Problem Relation Age of Onset  . Brain cancer Mother 64  . Cancer Sister 53        breast  . Hypertension Paternal Grandmother   . Depression Paternal Grandmother        suicide    Social History Social History   Tobacco Use  . Smoking status: Never Smoker  . Smokeless tobacco: Never Used  Vaping Use  . Vaping Use: Never used  Substance Use Topics  . Alcohol use: Yes    Alcohol/week: 4.0 standard drinks    Types: 4 Cans of beer per week  . Drug use: No     Allergies   Patient has no known allergies.   Review of Systems Review of Systems Pertinent negatives listed in HPI  Physical Exam Triage Vital Signs ED Triage Vitals  Enc Vitals Group     BP 07/31/20 0955 128/77     Pulse Rate 07/31/20 0955 80     Resp 07/31/20 0955 20     Temp 07/31/20 0955 98.2 F (36.8 C)     Temp src --  SpO2 07/31/20 0955 94 %     Weight --      Height --      Head Circumference --      Peak Flow --      Pain Score 07/31/20 0954 0     Pain Loc --      Pain Edu? --      Excl. in [place]? --    No data found.  Updated Vital Signs BP 128/77   Pulse 80   Temp 98.2 F (36.8 C)   Resp 20   SpO2 94%   Visual Acuity Right Eye Distance:   Left Eye Distance:   Bilateral Distance:    Right Eye Near:   Left Eye Near:    Bilateral Near:     Physical Exam  General Appearance:    Alert, cooperative, no distress  HENT:   Normocephalic, ears normal, nares mucosal edema with congestion, rhinorrhea, oropharynx erythematous and edematous without exudate   Eyes:    PERRL, conjunctiva/corneas clear, EOM's intact       Lungs:     Clear to auscultation bilaterally, respirations unlabored  Heart:    Regular rate and rhythm  Neurologic:   Awake, alert, oriented x 3. No apparent focal neurological           defect.      UC Treatments / Results  Labs (all labs ordered are listed, but only abnormal results are displayed) Labs Reviewed  CULTURE, GROUP A STREP [facility])  POCT RAPID STREP A (OFFICE)    EKG   Radiology No results found.  Procedures Procedures  (including critical care time)  Medications Ordered in UC Medications - No data to display  Initial Impression / Assessment and Plan / UC Course  I have reviewed the triage vital signs and the nursing notes.  Pertinent labs & imaging results that were available during my care of the patient were reviewed by me and considered in my medical decision making (see chart for details).     Pharyngitis.  Prednisone 40 mg once daily for 5 days.  Bacterial conjunctivitis of both eyes erythromycin ointment once daily for 5 days.  Pruritic papules on back secondary to an insect bite treating with triamcinolone cream.  Follow-up with PCP as needed.  Rapid strep negative throat culture pending.  Will notify patient if results warrant any antibiotic treatment.  Final Clinical Impressions(s) / UC Diagnoses   Final diagnoses:  Insect bite of back wall of thorax, unspecified location, initial encounter  Papules  Acute bacterial conjunctivitis of both eyes  Viral pharyngitis     Discharge Instructions     You are being treated for conjunctivitis , apply erythromycin eye ointment to lower eyelid at bedtime for 5 days.  Apply warm compresses to remove any drainage from eye. Continue daily allergy medicine. For viral pharyngitis, start prednisone 40 mg once daily with breakfast and take over the next 5 days to relieve the inflammation of your throat.  Also gargle with warm salt water to reduce throat irritation.      ED Prescriptions    Medication Sig Dispense Auth. Provider   triamcinolone cream (KENALOG) 0.1 % Apply 1 application topically 2 (two) times daily. Use for 7 days then as needed for insect bites 30 g Scot Jun, FNP   predniSONE (DELTASONE) 20 MG tablet Take 2 tablets (40 mg total) by mouth daily with breakfast. 10 tablet Scot Jun, FNP   erythromycin ophthalmic ointment Place a 1/2  inch ribbon of ointment into the lower eyelid x 5 days 3.5 g Scot Jun, FNP      PDMP not reviewed this encounter.   Scot Jun, [place] 08/07/20 [phone]

## 2020-08-03 LAB — CULTURE, GROUP A STREP (THRC)

## 2020-10-20 ENCOUNTER — Telehealth: Payer: Self-pay

## 2020-10-20 NOTE — Telephone Encounter (Signed)
Start on the following supplement for supportive care:  Vitamin C 500 mg tablet one by mouth twice daily  Vitamin D 2000 units one by mouth daily   Zinc 50 mg tablet one by mouth daily   Notify provider or go to ED if you develop any shortness of breath,chest tightness,chest pain or palpitation.  Hold off on getting booster vaccine for now until 4- 6 weeks.

## 2020-10-20 NOTE — Telephone Encounter (Signed)
Patient called to advised Dr. Sabra Heck that he tested positive for COVID on yesterday, 10/19/2020. He also would like to know what he needs to do for and how long does he needs to wait before f=getting his COVID booster? Please advise

## 2020-10-23 NOTE — Telephone Encounter (Signed)
Called patient to advise and no answer, LVMTRC.

## 2020-10-24 DIAGNOSIS — E291 Testicular hypofunction: Secondary | ICD-10-CM | POA: Diagnosis not present

## 2020-10-24 DIAGNOSIS — R3912 Poor urinary stream: Secondary | ICD-10-CM | POA: Diagnosis not present

## 2020-10-24 DIAGNOSIS — N401 Enlarged prostate with lower urinary tract symptoms: Secondary | ICD-10-CM | POA: Diagnosis not present

## 2020-10-24 NOTE — Telephone Encounter (Signed)
Patient called back and stated that he was feeling much better.

## 2020-10-30 DIAGNOSIS — E291 Testicular hypofunction: Secondary | ICD-10-CM | POA: Diagnosis not present

## 2020-10-30 DIAGNOSIS — Z87442 Personal history of urinary calculi: Secondary | ICD-10-CM | POA: Diagnosis not present

## 2020-10-30 DIAGNOSIS — R3912 Poor urinary stream: Secondary | ICD-10-CM | POA: Diagnosis not present

## 2020-10-30 DIAGNOSIS — N401 Enlarged prostate with lower urinary tract symptoms: Secondary | ICD-10-CM | POA: Diagnosis not present

## 2020-10-30 DIAGNOSIS — N5201 Erectile dysfunction due to arterial insufficiency: Secondary | ICD-10-CM | POA: Diagnosis not present

## 2021-04-06 ENCOUNTER — Other Ambulatory Visit: Payer: Self-pay

## 2021-04-06 DIAGNOSIS — N183 Chronic kidney disease, stage 3 unspecified: Secondary | ICD-10-CM

## 2021-04-06 DIAGNOSIS — Z13220 Encounter for screening for lipoid disorders: Secondary | ICD-10-CM

## 2021-04-06 DIAGNOSIS — D509 Iron deficiency anemia, unspecified: Secondary | ICD-10-CM

## 2021-04-06 DIAGNOSIS — R739 Hyperglycemia, unspecified: Secondary | ICD-10-CM

## 2021-04-19 ENCOUNTER — Other Ambulatory Visit: Payer: Self-pay

## 2021-04-19 ENCOUNTER — Other Ambulatory Visit: Payer: Federal, State, Local not specified - PPO

## 2021-04-19 DIAGNOSIS — Z13220 Encounter for screening for lipoid disorders: Secondary | ICD-10-CM

## 2021-04-19 DIAGNOSIS — R739 Hyperglycemia, unspecified: Secondary | ICD-10-CM

## 2021-04-19 DIAGNOSIS — D509 Iron deficiency anemia, unspecified: Secondary | ICD-10-CM

## 2021-04-19 DIAGNOSIS — N183 Chronic kidney disease, stage 3 unspecified: Secondary | ICD-10-CM

## 2021-04-20 LAB — CBC WITH DIFFERENTIAL/PLATELET
Absolute Monocytes: 489 cells/uL (ref 200–950)
Basophils Absolute: 68 cells/uL (ref 0–200)
Basophils Relative: 1.3 %
Eosinophils Absolute: 312 cells/uL (ref 15–500)
Eosinophils Relative: 6 %
HCT: 46.4 % (ref 38.5–50.0)
Hemoglobin: 15.8 g/dL (ref 13.2–17.1)
Lymphs Abs: 1706 cells/uL (ref 850–3900)
MCH: 33.2 pg — ABNORMAL HIGH (ref 27.0–33.0)
MCHC: 34.1 g/dL (ref 32.0–36.0)
MCV: 97.5 fL (ref 80.0–100.0)
MPV: 9.7 fL (ref 7.5–12.5)
Monocytes Relative: 9.4 %
Neutro Abs: 2626 cells/uL (ref 1500–7800)
Neutrophils Relative %: 50.5 %
Platelets: 270 10*3/uL (ref 140–400)
RBC: 4.76 10*6/uL (ref 4.20–5.80)
RDW: 12 % (ref 11.0–15.0)
Total Lymphocyte: 32.8 %
WBC: 5.2 10*3/uL (ref 3.8–10.8)

## 2021-04-20 LAB — COMPLETE METABOLIC PANEL WITH GFR
AG Ratio: 1.7 (calc) (ref 1.0–2.5)
ALT: 16 U/L (ref 9–46)
AST: 16 U/L (ref 10–35)
Albumin: 4 g/dL (ref 3.6–5.1)
Alkaline phosphatase (APISO): 49 U/L (ref 35–144)
BUN/Creatinine Ratio: 14 (calc) (ref 6–22)
BUN: 19 mg/dL (ref 7–25)
CO2: 31 mmol/L (ref 20–32)
Calcium: 9.2 mg/dL (ref 8.6–10.3)
Chloride: 107 mmol/L (ref 98–110)
Creat: 1.36 mg/dL — ABNORMAL HIGH (ref 0.70–1.28)
Globulin: 2.4 g/dL (calc) (ref 1.9–3.7)
Glucose, Bld: 97 mg/dL (ref 65–99)
Potassium: 4.8 mmol/L (ref 3.5–5.3)
Sodium: 143 mmol/L (ref 135–146)
Total Bilirubin: 0.5 mg/dL (ref 0.2–1.2)
Total Protein: 6.4 g/dL (ref 6.1–8.1)
eGFR: 54 mL/min/{1.73_m2} — ABNORMAL LOW (ref >=60)

## 2021-04-20 LAB — HEMOGLOBIN A1C
Hgb A1c MFr Bld: 5.7 % of total Hgb — ABNORMAL HIGH (ref <5.7)
Mean Plasma Glucose: 117 mg/dL
eAG (mmol/L): 6.5 mmol/L

## 2021-04-20 LAB — LIPID PANEL
Cholesterol: 158 mg/dL (ref <200)
HDL: 54 mg/dL (ref >=40)
LDL Cholesterol (Calc): 90 mg/dL (calc)
Non-HDL Cholesterol (Calc): 104 mg/dL (calc) (ref <130)
Total CHOL/HDL Ratio: 2.9 (calc) (ref <5.0)
Triglycerides: 56 mg/dL (ref <150)

## 2021-04-23 ENCOUNTER — Ambulatory Visit: Payer: Federal, State, Local not specified - PPO | Admitting: Internal Medicine

## 2021-04-24 ENCOUNTER — Ambulatory Visit: Payer: Federal, State, Local not specified - PPO | Admitting: Family Medicine

## 2021-04-24 ENCOUNTER — Other Ambulatory Visit: Payer: Self-pay

## 2021-04-24 ENCOUNTER — Encounter: Payer: Self-pay | Admitting: Family Medicine

## 2021-04-24 VITALS — BP 118/70 | HR 76 | Temp 97.7°F | Ht 69.0 in | Wt 161.4 lb

## 2021-04-24 DIAGNOSIS — H6123 Impacted cerumen, bilateral: Secondary | ICD-10-CM

## 2021-04-24 DIAGNOSIS — E291 Testicular hypofunction: Secondary | ICD-10-CM | POA: Diagnosis not present

## 2021-04-24 NOTE — Progress Notes (Signed)
Provider:  Alain Honey, MD  Careteam: Patient Care Team: Wardell Honour, MD as PCP - General (Family Medicine) Specialists, [name] (Orthopedic Surgery) Druscilla Brownie, MD as Consulting Physician (Dermatology) [name], MD as Consulting Physician (Gastroenterology) Franchot Gallo, MD as Consulting Physician (Urology) Darleen Crocker, MD as Consulting Physician (Ophthalmology) Druscilla Brownie, MD as Referring Physician (Dermatology) Berle Mull, MD as Consulting Physician (Sports Medicine)  PLACE [facility] SERVICE:  [facility] Directive information    No Known Allergies  Chief Complaint  Patient presents with   Medical Management of Chronic Issues    Patient presents today for a 1 year follow-up.     HPI: Patient is a 76 y.o. male .  Patient presents for 1 year follow-up.  He denies any new problems but has some chronic minor issues including buildup of cerumen and bilateral shoulder pain, left greater than right. We reviewed his lab work done last week and it is all normal with exception of some very slight decline in renal function.  He is very borderline prediabetic.  He continues to use testosterone gel and is followed by urology to check his testosterone levels  Review of Systems:  Review of Systems  Constitutional: Negative.   Respiratory: Negative.    Cardiovascular: Negative.   Gastrointestinal: Negative.   Neurological: Negative.   Psychiatric/Behavioral: Negative.    All other systems reviewed and are negative.  Past Medical History:  Diagnosis Date   Arthritis    BPH (benign prostatic hyperplasia)    ED (erectile dysfunction)    Elevated PSA    History of acute renal failure    08/ 2017   History of adenomatous polyp of colon    2005 TUBULAR ADENOMA   History of herpes genitalis    History of kidney stones    Hypogonadism, testicular    Lower urinary tract symptoms (LUTS)    Nephrolithiasis     multiple right side non-obstructive per ct 11-27-2015   Peyronie's disease    Right ureteral stone    Past Surgical History:  Procedure Laterality Date   ABDOMINAL HERNIA REPAIR  02/2014   CATARACT EXTRACTION     COLONOSCOPY WITH PROPOFOL N/A 06/06/2015   Procedure: COLONOSCOPY WITH PROPOFOL;  Surgeon: [name], MD;  Location: [facility];  Service: Endoscopy;  Laterality: N/A;   CYSTOSCOPY WITH RETROGRADE PYELOGRAM, URETEROSCOPY AND STENT PLACEMENT Right 11/30/2015   Procedure: CYSTOSCOPY/RETROGRADE/URETEROSCOPY/;  Surgeon: [name], MD;  Location[facility];  Service: Urology;  Laterality: Right;   CYSTOSCOPY/URETEROSCOPY/HOLMIUM LASER/STENT PLACEMENT Right 12/20/2015   Procedure: CYSTOSCOPY/URETEROSCOPY/HOLMIUM LASER/ REMOVE RIGHT JJ STENT;  Surgeon: [name], MD;  Location: [facility];  Service: Urology;  Laterality: Right;  1 HOUR   CYSTOSCOPY/URETEROSCOPY/HOLMIUM LASER/STENT PLACEMENT Right 01/13/2018   Procedure: CYSTOSCOPY/URETEROSCOPY/HOLMIUM LASER/STONE REMOVAL/STENT PLACEMENT;  Surgeon: [name], MD;  Location: [facility];  Service: Urology;  Laterality: Right;   INGUINAL HERNIA REPAIR Left 2001   KIDNEY STONE SURGERY     x2 times   KNEE ARTHROSCOPY Left 4098   NESBIT PLICATION FOR PEYRONIE'S DISEASE  05/01/2013   and Right Inguinal Hernia Repair   TONSILLECTOMY  child   Social History:   reports that he has never smoked. He has never used smokeless tobacco. He reports current alcohol use of about 4.0 standard drinks per week. He reports that he does not use drugs.  Family History  Problem Relation Age of Onset   Brain cancer Mother 30  Cancer Sister 71       breast   Hypertension Paternal Grandmother    Depression Paternal Grandmother        suicide    Medications: Patient's Medications  New Prescriptions   No medications on file  Previous Medications   ASCORBIC ACID (VITAMIN C) 500 MG TABLET    Take 500 mg  by mouth daily.   CHOLECALCIFEROL (VITAMIN D-3) 5000 UNITS TABS    Take 1 tablet by mouth every morning.   CO-ENZYME Q-10 30 MG CAPSULE    Take 30 mg by mouth daily.    FERROUS SULFATE 325 (65 FE) MG TABLET    Take 325 mg by mouth 3 (three) times a week.    FEXOFENADINE (ALLEGRA) 180 MG TABLET    Take 180 mg by mouth daily.    GINKGO BILOBA 40 MG TABS    Take by mouth. Takes 60 mg daily   LACTOBACILLUS (PROBIOTIC ACIDOPHILUS PO)    Take 1 capsule by mouth daily.    MULTIPLE VITAMIN (MULTIVITAMIN WITH MINERALS) TABS TABLET    Take 1 tablet by mouth daily.   OMEGA-3 FATTY ACIDS (OMEGA 3 PO)    Take 1 tablet by mouth every morning.   TAMSULOSIN (FLOMAX) 0.4 MG CAPS CAPSULE    Take 0.4 mg by mouth every morning.   TESTOSTERONE 10 MG/ACT (2%) GEL    Apply 1 application topically daily.   TRIAMCINOLONE CREAM (KENALOG) 0.1 %    Apply 1 application topically 2 (two) times daily. Use for 7 days then as needed for insect bites   TURMERIC 450 MG CAPS    Take 1 capsule by mouth daily.   VITAMIN B-12 (CYANOCOBALAMIN) 100 MCG TABLET    Take 100 mcg by mouth daily.  Modified Medications   No medications on file  Discontinued Medications   ERYTHROMYCIN OPHTHALMIC OINTMENT    Place a 1/2 inch ribbon of ointment into the lower eyelid x 5 days   GUAIFENESIN-DEXTROMETHORPHAN (ROBITUSSIN DM) 100-10 MG/5ML SYRUP    Take 5 mLs by mouth every 4 (four) hours as needed for cough.   PREDNISONE (DELTASONE) 20 MG TABLET    Take 2 tablets (40 mg total) by mouth daily with breakfast.    Physical Exam:  Vitals:   04/24/21 1358  Weight: 161 lb 6.4 oz (73.2 kg)  Height: 5\' 9"  (1.753 m)   Body mass index is 23.83 kg/m. Wt Readings from Last 3 Encounters:  04/24/21 161 lb 6.4 oz (73.2 kg)  04/26/20 158 lb 3.2 oz (71.8 kg)  04/20/20 158 lb 3.2 oz (71.8 kg)    Physical Exam Vitals and nursing note reviewed.  Constitutional:      Appearance: Normal appearance.  HENT:     Ears:     Comments: Both ears have  cerumen and were irrigated left 1 cleared right 1 still has some cerumen.  We discussed measures for treating and prevention Cardiovascular:     Rate and Rhythm: Normal rate and regular rhythm.     Pulses: Normal pulses.  Pulmonary:     Effort: Pulmonary effort is normal.     Breath sounds: Normal breath sounds.  Abdominal:     General: Abdomen is flat. Bowel sounds are normal.     Palpations: Abdomen is soft.  Genitourinary:    Comments: Deferred to urology Musculoskeletal:        General: Normal range of motion.     Cervical back: Normal range of motion.  Comments: Patient has normal range of motion in his shoulders but there is decreased internal rotation involving the left shoulder suggesting rotator cuff impingement or tear  Skin:    General: Skin is warm and dry.  Neurological:     General: No focal deficit present.     Mental Status: He is alert and oriented to person, place, and time.  Psychiatric:        Mood and Affect: Mood normal.        Behavior: Behavior normal.    Labs reviewed: Basic Metabolic Panel: Recent Labs    04/19/21 0815  NA 143  K 4.8  CL 107  CO2 31  GLUCOSE 97  BUN 19  CREATININE 1.36*  CALCIUM 9.2   Liver Function Tests: Recent Labs    04/19/21 0815  AST 16  ALT 16  BILITOT 0.5  PROT 6.4   No results for input(s): LIPASE, AMYLASE in the last 8760 hours. No results for input(s): AMMONIA in the last 8760 hours. CBC: Recent Labs    04/19/21 0815  WBC 5.2  NEUTROABS 2,626  HGB 15.8  HCT 46.4  MCV 97.5  PLT 270   Lipid Panel: Recent Labs    04/19/21 0815  CHOL 158  HDL 54  LDLCALC 90  TRIG 56  CHOLHDL 2.9   TSH: No results for input(s): TSH in the last 8760 hours. A1C: Lab Results  Component Value Date   HGBA1C 5.7 (H) 04/19/2021     Assessment/Plan  1. Hypogonadism, testicular Continue with testosterone gel as per urology  2. Bilateral impacted cerumen Irrigated bilaterally today but still has some  cerumen in right external canal.  Discussed prevention going forward   Alain Honey, MD [place] [phone]

## 2021-04-25 ENCOUNTER — Other Ambulatory Visit: Payer: Self-pay | Admitting: Family Medicine

## 2021-04-25 DIAGNOSIS — S46012A Strain of muscle(s) and tendon(s) of the rotator cuff of left shoulder, initial encounter: Secondary | ICD-10-CM

## 2021-04-25 MED ORDER — NITROGLYCERIN 0.1 MG/HR TD PT24: 0.2000 mg | MEDICATED_PATCH | Freq: Every day | TRANSDERMAL | Status: DC

## 2021-04-26 ENCOUNTER — Telehealth: Payer: Self-pay

## 2021-04-26 NOTE — Telephone Encounter (Signed)
Message left on clinical intake voicemail:   Patient seen Dr.Miller on Tuesday and he was suppose to send over a rx to the pharmacy and it is still not there (2 days later).   Patient did not leave the name of medication  Call returned to patient, Left message on voicemail for patient to return call when available with the name of the medication.  Awaiting for return call

## 2021-04-27 ENCOUNTER — Other Ambulatory Visit: Payer: Self-pay

## 2021-04-27 MED ORDER — NITROGLYCERIN 0.2 MG/HR TD PT24: 0.2000 mg | MEDICATED_PATCH | TRANSDERMAL | 5 refills | Status: AC

## 2021-04-27 NOTE — Telephone Encounter (Signed)
Patient called stating that prescription for nitroglycerin patches was not sent to pharmacy.  Medication sent to [facility] This encounter was created in error - please disregard.

## 2021-04-27 NOTE — Telephone Encounter (Signed)
Patient called back and stated that medication for nitroglycerin patch. Medication was sent to pharmacy.

## 2021-09-28 ENCOUNTER — Ambulatory Visit
Admission: EM | Admit: 2021-09-28 | Discharge: 2021-09-28 | Disposition: A | Discharge status: Home / Self Care | Payer: Federal, State, Local not specified - PPO | Attending: Emergency Medicine | Admitting: Emergency Medicine

## 2021-09-28 DIAGNOSIS — L03031 Cellulitis of right toe: Secondary | ICD-10-CM

## 2021-09-28 MED ORDER — SULFAMETHOXAZOLE-TRIMETHOPRIM 800-160 MG PO TABS: 1.0000 | ORAL_TABLET | Freq: Two times a day (BID) | ORAL | 0 refills | Status: AC

## 2021-09-28 NOTE — ED Triage Notes (Signed)
Pt c/o tick bite, to right 4th toe and possible spider bite to his left wrist.   Started: Wednesday

## 2021-09-28 NOTE — Discharge Instructions (Signed)
For the infection on your right fourth toe, please begin taking Bactrim, 1 tablet every 12 hours until complete.  I have sent the prescription to your pharmacy and it should be ready within the hour.  Please monitor your toe to make sure that the redness and swelling are improving over the next 5 days.  If they appear to be getting worse please go to the emergency room for further, more acute evaluation.  If they have not completely resolved after 5 days of antibiotics, please either follow-up with your primary care provider or return to urgent care for repeat evaluation.  It was a pleasure to meet you.  We appreciate the opportunity to participate in your care.

## 2021-09-28 NOTE — ED Provider Notes (Signed)
[facility]    CSN: [id number] Arrival date & time: 09/28/21  1931    HISTORY   Chief Complaint  Patient presents with   Insect Bite   HPI Jesus [name] is a 76 y.o. male. Patient presents to urgent care complaining of a tick bite on his right fourth toe as well as a possible spider bite on his left wrist.  Patient states he does a lot of yard work.  Patient states he noticed the tick on his right fourth toe 2 days ago around 6 PM, states he believes that it was only on him for a few hours and when he pulled it off it was flat, not engorged.  Patient states initially his toe was just itchy but earlier today he noticed that it had become very red, swollen and a little bit tender to touch.  Patient states the lesion on his left wrist is a little red and itchy but not bothering him at this time and stated that he certainly would not of come in for that issue alone.  The history is provided by the patient.   Past Medical History:  Diagnosis Date   Arthritis    BPH (benign prostatic hyperplasia)    ED (erectile dysfunction)    Elevated PSA    History of acute renal failure    08/ 2017   History of adenomatous polyp of colon    2005 TUBULAR ADENOMA   History of herpes genitalis    History of kidney stones    Hypogonadism, testicular    Lower urinary tract symptoms (LUTS)    Nephrolithiasis    multiple right side non-obstructive per ct 11-27-2015   Peyronie's disease    Right ureteral stone    Patient Active Problem List   Diagnosis Date Noted   Need for shingles vaccine 04/12/2019   Need for Tdap vaccination 04/12/2019   Osteoarthritis of knee 02/04/2019   Impacted cerumen 02/04/2019   Decreased hearing, left 11/20/2018   History of chronic constipation 11/20/2018   Iron deficiency anemia 12/30/2017   h/o B12 deficiency 12/30/2017   Muscle spasms- b/l upper traps 09/18/2017   Tingling in extremities 09/12/2017   Neuropathy, arm, 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  09/12/2017   Neuropathy of forearm, right 09/12/2017   Arthropathy of cervical facet joint 09/12/2017   Vitamin D deficiency 06/26/2017   Plantar fasciitis of left foot 03/26/2017   Glucose intolerance (impaired glucose tolerance) 12/12/2016   CKD (chronic kidney disease), stage III ([place]) 12/12/2016   Hypogonadism, testicular 11/28/2016   Elevated PSA 11/28/2016   Generalized OA 11/26/2016   BPH (benign prostatic hyperplasia) 11/26/2016   Environmental and seasonal allergies 11/26/2016   h/o Inguinal hernia- b/l and abd hernia repaired with mesh 11/26/2016   S/P colonoscopy 11/26/2016   h/o Renal stones- ca oxalate.  11/26/2016   Cervicalgia 11/26/2016   Peripheral neuropathy 11/26/2016   Past Surgical History:  Procedure Laterality Date   ABDOMINAL HERNIA REPAIR  02/2014   CATARACT EXTRACTION     COLONOSCOPY WITH PROPOFOL N/A 06/06/2015   Procedure: COLONOSCOPY WITH PROPOFOL;  Surgeon: [name], MD;  Location: [facility];  Service: Endoscopy;  Laterality: N/A;   CYSTOSCOPY WITH RETROGRADE PYELOGRAM, URETEROSCOPY AND STENT PLACEMENT Right 11/30/2015   Procedure: CYSTOSCOPY/RETROGRADE/URETEROSCOPY/;  Surgeon: [name], MD;  Location: [facility];  Service: Urology;  Laterality: Right;   CYSTOSCOPY/URETEROSCOPY/HOLMIUM LASER/STENT PLACEMENT Right 12/20/2015   Procedure: CYSTOSCOPY/URETEROSCOPY/HOLMIUM LASER/ REMOVE RIGHT JJ STENT;  Surgeon: [name],  MD;  Location: [place];  Service: Urology;  Laterality: Right;  1 HOUR   CYSTOSCOPY/URETEROSCOPY/HOLMIUM LASER/STENT PLACEMENT Right 01/13/2018   Procedure: CYSTOSCOPY/URETEROSCOPY/HOLMIUM LASER/STONE REMOVAL/STENT PLACEMENT;  Surgeon: [name], MD;  Location: [facility];  Service: Urology;  Laterality: Right;   INGUINAL HERNIA REPAIR Left 2001   KIDNEY STONE SURGERY     x2 times   KNEE ARTHROSCOPY Left 1607   NESBIT PLICATION FOR PEYRONIE'S DISEASE  05/01/2013   and Right  Inguinal Hernia Repair   TONSILLECTOMY  child    Home Medications    Prior to Admission medications   Medication Sig Start Date End Date Taking? Authorizing Provider  sulfamethoxazole-trimethoprim (BACTRIM DS) 800-160 MG tablet Take 1 tablet by mouth 2 (two) times daily for 5 days. 09/28/21 10/03/21 Yes Lynden Oxford Scales, PA-C  ascorbic acid (VITAMIN C) 500 MG tablet Take 500 mg by mouth daily.    [provider]  Cholecalciferol (VITAMIN D-3) 5000 units TABS Take 1 tablet by mouth every morning.    [provider]  co-enzyme Q-10 30 MG capsule Take 30 mg by mouth daily.     [provider]  ferrous sulfate 325 (65 FE) MG tablet Take 325 mg by mouth 3 (three) times a week.     [provider]  fexofenadine (ALLEGRA) 180 MG tablet Take 180 mg by mouth daily.     [provider]  Ginkgo Biloba 40 MG TABS Take by mouth. Takes 60 mg daily    [provider]  Lactobacillus (PROBIOTIC ACIDOPHILUS PO) Take 1 capsule by mouth daily.     [provider]  Multiple Vitamin (MULTIVITAMIN WITH MINERALS) TABS tablet Take 1 tablet by mouth daily.    [provider]  nitroGLYCERIN (NITRODUR - DOSED IN MG/24 HR) 0.2 mg/hr patch Place 1 patch (0.2 mg total) onto the skin as directed. Apply one fourth patch to affected area on shoulder daily 04/27/21   Wardell Honour, MD  Omega-3 Fatty Acids (OMEGA 3 PO) Take 1 tablet by mouth every morning.    [provider]  tamsulosin (FLOMAX) 0.4 MG CAPS capsule Take 0.4 mg by mouth every morning. 05/13/15   [provider]  Testosterone 10 MG/ACT (2%) GEL Apply 1 application topically daily. 12/19/16   [provider]  triamcinolone cream (KENALOG) 0.1 % Apply 1 application topically 2 (two) times daily. Use for 7 days then as needed for insect bites 07/31/20   Scot Jun, FNP  Turmeric 450 MG CAPS Take 1 capsule by mouth daily.    [provider]  vitamin  B-12 (CYANOCOBALAMIN) 100 MCG tablet Take 100 mcg by mouth daily.    [provider]    Family History Family History  Problem Relation Age of Onset   Brain cancer Mother 39   Cancer Sister 70       breast   Hypertension Paternal Grandmother    Depression Paternal Grandmother        suicide   Social History Social History   Tobacco Use   Smoking status: Never   Smokeless tobacco: Never  Vaping Use   Vaping Use: Never used  Substance Use Topics   Alcohol use: Yes    Alcohol/week: 4.0 standard drinks of alcohol    Types: 4 Cans of beer per week   Drug use: No   Allergies   Patient has no known allergies.  Review of Systems Review of Systems Pertinent findings noted in history of  present illness.   Physical Exam Triage Vital Signs ED Triage Vitals  Enc Vitals Group     BP 02/09/21 0827 (!) 147/82     Pulse Rate 02/09/21 0827 72     Resp 02/09/21 0827 18     Temp 02/09/21 0827 98.3 F (36.8 C)     Temp Source 02/09/21 0827 Oral     SpO2 02/09/21 0827 98 %     Weight --      Height --      Head Circumference --      Peak Flow --      Pain Score 02/09/21 0826 5     Pain Loc --      Pain Edu? --      Excl. in [place]? --   No data found.  Updated Vital Signs BP 122/73 (BP Location: Right Arm)   Pulse 83   Temp 97.7 F (36.5 C) (Oral)   Resp 18   SpO2 94%   Physical Exam Constitutional:      General: He is awake. He is not in acute distress.    Appearance: Normal appearance. He is well-developed, well-groomed and normal weight. He is not ill-appearing.  Skin:    Comments: Redness and inflammation of anterior aspect of fourth toe medial to the nail  Neurological:     Mental Status: He is alert.  Psychiatric:        Behavior: Behavior is cooperative.     Visual Acuity Right Eye Distance:   Left Eye Distance:   Bilateral Distance:    Right Eye Near:   Left Eye Near:    Bilateral Near:     UC Couse / Diagnostics / Procedures:     EKG  Radiology No results found.  Procedures Procedures (including critical care time)  UC Diagnoses / Final Clinical Impressions(s)   I have reviewed the triage vital signs and the nursing notes.  Pertinent labs & imaging results that were available during my care of the patient were reviewed by me and considered in my medical decision making (see chart for details).    Final diagnoses:  Cellulitis of toe of right foot   Patient advised that there is no concern for transmission of RMSF or Lyme disease given short duration of tick bite and tick not being engorged.  Patient provided with prescription for Bactrim for presumed bacterial infection/cellulitis and right fourth toe.  Return precautions advised.  ED Prescriptions     Medication Sig Dispense Auth. Provider   sulfamethoxazole-trimethoprim (BACTRIM DS) 800-160 MG tablet Take 1 tablet by mouth 2 (two) times daily for 5 days. 10 tablet Lynden Oxford Scales, PA-C      PDMP not reviewed this encounter.  Pending results:  Labs Reviewed - No data to display  Medications Ordered in UC: Medications - No data to display  Disposition Upon Discharge:  Condition: stable for discharge home Home: take medications as prescribed; routine discharge instructions as discussed; follow up as advised.  Patient presented with an acute illness with associated systemic symptoms and significant discomfort requiring urgent management. In my opinion, this is a condition that a prudent lay person (someone who possesses an average knowledge of health and medicine) may potentially expect to result in complications if not addressed urgently such as respiratory distress, impairment of bodily function or dysfunction of bodily organs.   Routine symptom specific, illness specific and/or disease specific instructions were discussed with the patient and/or caregiver at length.   As such, the  patient has been evaluated and assessed, work-up was performed  and treatment was provided in alignment with urgent care protocols and evidence based medicine.  Patient/parent/caregiver has been advised that the patient may require follow up for further testing and treatment if the symptoms continue in spite of treatment, as clinically indicated and appropriate.  Patient/parent/caregiver has been advised to return to the [facility] or PCP if no better; to PCP or the Emergency Department if new signs and symptoms develop, or if the current signs or symptoms continue to change or worsen for further workup, evaluation and treatment as clinically indicated and appropriate  The patient will follow up with their current PCP if and as advised. If the patient does not currently have a PCP we will assist them in obtaining one.   The patient may need specialty follow up if the symptoms continue, in spite of conservative treatment and management, for further workup, evaluation, consultation and treatment as clinically indicated and appropriate.   Patient/parent/caregiver verbalized understanding and agreement of plan as discussed.  All questions were addressed during visit.  Please see discharge instructions below for further details of plan.  Discharge Instructions:   Discharge Instructions      For the infection on your right fourth toe, please begin taking Bactrim, 1 tablet every 12 hours until complete.  I have sent the prescription to your pharmacy and it should be ready within the hour.  Please monitor your toe to make sure that the redness and swelling are improving over the next 5 days.  If they appear to be getting worse please go to the emergency room for further, more acute evaluation.  If they have not completely resolved after 5 days of antibiotics, please either follow-up with your primary care provider or return to urgent care for repeat evaluation.  It was a pleasure to meet you.  We appreciate the opportunity to participate in your care.    This office  note has been dictated using Museum/gallery curator.  Unfortunately, and despite my best efforts, this method of dictation can sometimes lead to occasional typographical or grammatical errors.  I apologize in advance if this occurs.     Lynden Oxford Scales, PA-C 09/28/21 2001

## 2022-11-07 ENCOUNTER — Ambulatory Visit: Payer: Federal, State, Local not specified - PPO | Admitting: Family Medicine

## 2022-11-07 ENCOUNTER — Encounter: Payer: Self-pay | Admitting: Family Medicine

## 2022-11-07 VITALS — BP 104/68 | HR 92 | Ht 70.0 in | Wt 158.4 lb

## 2022-11-07 DIAGNOSIS — R21 Rash and other nonspecific skin eruption: Secondary | ICD-10-CM

## 2022-11-07 DIAGNOSIS — R7303 Prediabetes: Secondary | ICD-10-CM | POA: Diagnosis not present

## 2022-11-07 DIAGNOSIS — N1831 Chronic kidney disease, stage 3a: Secondary | ICD-10-CM

## 2022-11-07 NOTE — Progress Notes (Unsigned)
New Patient Office Visit  Subjective    Patient ID: Jesus [name], male    DOB: 1946-02-18  Age: 77 y.o. MRN: [id number]  CC:  Chief Complaint  Patient presents with   New Patient (Initial Visit)    HPI PRATHER FAILLA presents to establish care Was previously seeing Dr. Hyacinth Meeker at [facility].  Rash-patient states he has never had shingles.  Has had the vaccine.  For the past 2 or 3 weeks has had a mild rash on the lower left back.  Describes it as "not too bad".  Really only notices if he is rubbing it.  For the first week or 2 it itched.  Decreased hearing-patient complains of decreased hearing in both ears.  Thinks he would benefit from ear lavage.  Has had that in the past.  Sees Dr. Retta Diones at [facility] urology.  Takes tamsulosin 0.8 mg/day  Goes to [facility] for steroid shot in knee and shoulder for chronic arthritis.  Patient complains of noticing issues with his balance.  Feels like it is not as "good as it used to be".  Does not lean towards 1 side when walking.  No numbness or tingling in the legs.  Golfs and dances ([place] and ballroom dancing).    PMH: Hypogonadism, CKD, prediabetes  PSH: N/A  FH: N/A  Tobacco use: no Alcohol use: 1/week Drug use: no Marital status: in process of divorce.  2 adult children.  3 grandchildren.  Employment: retired - 23 years.  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   Sexual hx: not recently  Screenings:  Colon Cancer: has had colonoscopies.  Aged out.     Outpatient Encounter Medications as of 11/07/2022  Medication Sig   ascorbic acid (VITAMIN C) 500 MG tablet Take 500 mg by mouth daily.   Cholecalciferol (VITAMIN D-3) 5000 units TABS Take 1 tablet by mouth every morning.   co-enzyme Q-10 30 MG capsule Take 30 mg by mouth daily.    ferrous sulfate 325 (65 FE) MG tablet Take 325 mg by mouth 3 (three) times a week.    fexofenadine (ALLEGRA) 180 MG tablet Take 180 mg by mouth daily.    Ginkgo Biloba 40 MG TABS Take by mouth.  Takes 60 mg daily   Lactobacillus (PROBIOTIC ACIDOPHILUS PO) Take 1 capsule by mouth daily.    Multiple Vitamin (MULTIVITAMIN WITH MINERALS) TABS tablet Take 1 tablet by mouth daily.   nitroGLYCERIN (NITRODUR - DOSED IN MG/24 HR) 0.2 mg/hr patch Place 1 patch (0.2 mg total) onto the skin as directed. Apply one fourth patch to affected area on shoulder daily   Omega-3 Fatty Acids (OMEGA 3 PO) Take 1 tablet by mouth every morning.   tamsulosin (FLOMAX) 0.4 MG CAPS capsule Take 0.4 mg by mouth every morning.   Testosterone 10 MG/ACT (2%) GEL Apply 1 application topically daily.   Turmeric 450 MG CAPS Take 1 capsule by mouth daily.   vitamin B-12 (CYANOCOBALAMIN) 100 MCG tablet Take 100 mcg by mouth daily.   triamcinolone cream (KENALOG) 0.1 % Apply 1 application topically 2 (two) times daily. Use for 7 days then as needed for insect bites (Patient not taking: Reported on 11/07/2022)   No facility-administered encounter medications on file as of 11/07/2022.    Past Medical History:  Diagnosis Date   Arthritis    BPH (benign prostatic hyperplasia)    ED (erectile dysfunction)    Elevated PSA    History of acute renal failure    08/  2017   History of adenomatous polyp of colon    2005 TUBULAR ADENOMA   History of herpes genitalis    History of kidney stones    Hypogonadism, testicular    Lower urinary tract symptoms (LUTS)    Nephrolithiasis    multiple right side non-obstructive per ct 11-27-2015   Peyronie's disease    Right ureteral stone     Past Surgical History:  Procedure Laterality Date   ABDOMINAL HERNIA REPAIR  02/2014   CATARACT EXTRACTION     COLONOSCOPY WITH PROPOFOL N/A 06/06/2015   Procedure: COLONOSCOPY WITH PROPOFOL;  Surgeon: Charolett Bumpers, MD;  Location: [facility];  Service: Endoscopy;  Laterality: N/A;   CYSTOSCOPY WITH RETROGRADE PYELOGRAM, URETEROSCOPY AND STENT PLACEMENT Right 11/30/2015   Procedure: CYSTOSCOPY/RETROGRADE/URETEROSCOPY/;  Surgeon: Jerilee Field, MD;  Location: [facility];  Service: Urology;  Laterality: Right;   CYSTOSCOPY/URETEROSCOPY/HOLMIUM LASER/STENT PLACEMENT Right 12/20/2015   Procedure: CYSTOSCOPY/URETEROSCOPY/HOLMIUM LASER/ REMOVE RIGHT JJ STENT;  Surgeon: Barron Alvine, MD;  Location: [facility];  Service: Urology;  Laterality: Right;  1 HOUR   CYSTOSCOPY/URETEROSCOPY/HOLMIUM LASER/STENT PLACEMENT Right 01/13/2018   Procedure: CYSTOSCOPY/URETEROSCOPY/HOLMIUM LASER/STONE REMOVAL/STENT PLACEMENT;  Surgeon: Crist Fat, MD;  [facility];  Service: Urology;  Laterality: Right;   INGUINAL HERNIA REPAIR Left 2001   KIDNEY STONE SURGERY     x2 times   KNEE ARTHROSCOPY Left 2005   NESBIT PLICATION FOR PEYRONIE'S DISEASE  05/01/2013   and Right Inguinal Hernia Repair   TONSILLECTOMY  child    Family History  Problem Relation Age of Onset   Brain cancer Mother 41   Cancer Sister 85       breast   Hypertension Paternal Grandmother    Depression Paternal Grandmother        suicide    Social History   Socioeconomic History   Marital status: Married    Spouse name: Not on file   Number of children: Not on file   Years of education: Not on file   Highest education level: Not on file  Occupational History   Not on file  Tobacco Use   Smoking status: Never   Smokeless tobacco: Never  Vaping Use   Vaping status: Never Used  Substance and Sexual Activity   Alcohol use: Yes    Alcohol/week: 4.0 standard drinks of alcohol    Types: 4 Cans of beer per week   Drug use: No   Sexual activity: Yes    Birth control/protection: None  Other Topics Concern   Not on file  Social History Narrative   Diet:  No      Do you drink/ eat things with caffeine? No      Marital status:    Married                           What year were you married ? 2015      Do you live in a house, apartment,assistred living, condo, trailer, etc.)?  House      Is it one or more stories? 2 Stories       How many persons live in your home ? 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   Yes                           Type & how often  [employer] Golf- 2/3 times a week; Exercise Class Yoga- 2 Times a week, Water Aerobics = when pool is open      ADVANCED DIRECTIVES (Please bring copies)      Do you have a living will? Yes      Do you have a DNR form?      Yes                 If not, do you want to discuss one?       Do you have signed POA?HPOA forms?   Yes              If so, please bring to your appointment      FUNCTIONAL STATUS- To be completed by Spouse / child / Staff       Do you have difficulty bathing or dressing yourself ?  No      Do you have difficulty preparing food or eating ?  No      Do you have difficulty managing your mediation ?  No      Do you have difficulty managing your finances ?  No      Do you have difficulty affording your medication ?  No      Social Determinants of Corporate investment banker Strain: Not on file  Food Insecurity: Not on file  Transportation Needs: Not on file  Physical Activity: Not on file  Stress: Not on file  Social Connections: Not on file  Intimate Partner Violence: Not on file    ROS      Objective    BP 104/68   Pulse 92   Ht 5\' 10"  (1.778 m)   Wt 158 lb 6.4 oz (71.8 kg)   SpO2 96%   BMI 22.73 kg/m   Physical Exam General: Alert and oriented.  No acute distress HEENT: PERRLA, EOMI CV: Regular rhythm, no murmurs Pulmonary: Lungs clear bilaterally GI: Soft, nontender. MSK: Normal gait. Psych: Pleasant affect Skin: Diffuse erythematous rash over the left side of the lower back.  Crosses dermatomes.       Assessment & Plan:   Stage 3a chronic kidney disease (HCC) -     Basic metabolic panel  Prediabetes -      Hemoglobin A1c  Rash Assessment & Plan: Diffuse erythematous rash.  Pruritic.  Appears to cross dermatomes which makes it less likely to be shingles.  Discussed symptomatic treatment options with patient.  Will monitor for now.  If it does not improve, consider punch biopsy.     Return in about 4 weeks (around 12/05/2022) for rash, ear lavage.   Sandre Kitty, MD

## 2022-11-07 NOTE — Patient Instructions (Addendum)
It was nice to see you today,  We addressed the following topics today: - follow up 1 month for ear lavage and rash follow up - for your rash we can use over the counter creams such as hydrocortisone, sarna anti itch, or benadryl cream - I will let you know the results of your labs when we get them.   Have a great day,  Frederic Jericho, MD

## 2022-11-10 DIAGNOSIS — R7303 Prediabetes: Secondary | ICD-10-CM | POA: Insufficient documentation

## 2022-11-10 DIAGNOSIS — R21 Rash and other nonspecific skin eruption: Secondary | ICD-10-CM | POA: Insufficient documentation

## 2022-11-10 NOTE — Assessment & Plan Note (Signed)
Diffuse erythematous rash.  Pruritic.  Appears to cross dermatomes which makes it less likely to be shingles.  Discussed symptomatic treatment options with patient.  Will monitor for now.  If it does not improve, consider punch biopsy.

## 2022-12-05 ENCOUNTER — Encounter: Payer: Self-pay | Admitting: Family Medicine

## 2022-12-05 ENCOUNTER — Ambulatory Visit: Payer: Federal, State, Local not specified - PPO | Admitting: Family Medicine

## 2022-12-05 VITALS — BP 99/62 | HR 77 | Ht 70.0 in | Wt 158.1 lb

## 2022-12-05 DIAGNOSIS — H6123 Impacted cerumen, bilateral: Secondary | ICD-10-CM

## 2022-12-05 DIAGNOSIS — K7689 Other specified diseases of liver: Secondary | ICD-10-CM | POA: Diagnosis not present

## 2022-12-05 DIAGNOSIS — R21 Rash and other nonspecific skin eruption: Secondary | ICD-10-CM | POA: Diagnosis not present

## 2022-12-05 NOTE — Progress Notes (Signed)
   Established Patient Office Visit  Subjective   Patient ID: Jesus [name], male    DOB: 1945/10/29  Age: 77 y.o. MRN: [id number]  Chief Complaint  Patient presents with   Medical Management of Chronic Issues    HPI Back rash - improving.  Patient has mild itching but states overall the rash has improved significantly.  No concerns.  Does not want to get any biopsy at this time.  Patient wants to get his ears cleaned out.  Has some mild decreased hearing but overall is not concerned.  Has tried hydrogen peroxide in the past but not consistently.  Discussed Debrox.  Patient recently had asymptomatic screening for peripheral artery disease, abdominal aortic aneurysm.  While he was doing this the person doing the ultrasound suggested following up with his doctor regarding something with his liver but patient did not know specifics and the paperwork he provided did not mention specifics.  We discussed his CT scan from 8 years ago that showed hepatic cyst and thickened gallbladder with gallstones.  Patient agreeable to getting right upper quadrant ultrasound.  The 10-year ASCVD risk score (Arnett DK, et al., 2019) is: 19.8%  Health Maintenance Due  Topic Date Due   COVID-19 Vaccine (4 - 2023-24 season) 12/14/2021   INFLUENZA VACCINE  11/14/2022      Objective:     BP 99/62   Pulse 77   Ht 5\' 10"  (1.778 m)   Wt 158 lb 1.9 oz (71.7 kg)   SpO2 97%   BMI 22.69 kg/m    Physical Exam General: Alert, oriented HEENT: Obstructing cerumen bilaterally.  Post lavage there was still cerumen obstructing the tympanic membranes CV: Regular rate and rhythm Pulmonary: Lungs clear bilaterally Skin: Rash on left-sided back appears improved.  Only appears to be a faint diffuse erythema.  No evidence of excoriation.  Scattered cherry angiomas present.    No results found for any visits on 12/05/22.      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  This is most likely what was noted on his abdominal aortic aneurysm screening.  Will send in right upper quadrant ultrasound.  Additional imaging as needed.  Liver function test.  No history of cirrhosis or no indication to suspect liver disease.  Orders: -     US ABDOMEN LIMITED RUQ (LIVER/GB); Future  Rash Assessment & Plan: Rash appears improving.  Only has faint pink diffuse rash can be seen on the left side posteriorly.  Nonpruritic for the most part.  Patient declines further workup at this time.  Advised patient we can biopsy it in the future if needed.   Bilateral impacted cerumen Assessment & Plan: Ear lavage performed today but patient still has cerumen obstructing his tympanic membrane's bilaterally.  Hearing appears to be only minimally affected if at all.  Discussed reasons to refer to ENT for cerumen removal including most commonly hearing loss.  Also recommended trying over-the-counter Debrox.      Return in about 6 months (around 06/07/2023) for 63m f/u.    Sandre Kitty, MD

## 2022-12-05 NOTE — Assessment & Plan Note (Signed)
Rash appears improving.  Only has faint pink diffuse rash can be seen on the left side posteriorly.  Nonpruritic for the most part.  Patient declines further workup at this time.  Advised patient we can biopsy it in the future if needed.

## 2022-12-05 NOTE — Patient Instructions (Addendum)
It was nice to see you today,  We addressed the following topics today: -Your cholesterol has always been well-controlled so we will not recheck it again today.  We can recheck it every other year as long as it continues to be normal. - I will order a right upper quadrant ultrasound to further evaluate what was seen on the abdominal aortic aneurysm screening test she did - If your rash does not improve let us know and we can always have it biopsied.  Have a great day,  Frederic Jericho, MD

## 2022-12-05 NOTE — Assessment & Plan Note (Signed)
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  This is most likely what was noted on his abdominal aortic aneurysm screening.  Will send in right upper quadrant ultrasound.  Additional imaging as needed.  Liver function test.  No history of cirrhosis or no indication to suspect liver disease.

## 2022-12-05 NOTE — Assessment & Plan Note (Signed)
Ear lavage performed today but patient still has cerumen obstructing his tympanic membrane's bilaterally.  Hearing appears to be only minimally affected if at all.  Discussed reasons to refer to ENT for cerumen removal including most commonly hearing loss.  Also recommended trying over-the-counter Debrox.

## 2022-12-17 ENCOUNTER — Ambulatory Visit
Admission: RE | Admit: 2022-12-17 | Discharge: 2022-12-17 | Disposition: A | Discharge status: Home / Self Care | Payer: Federal, State, Local not specified - PPO | Source: Ambulatory Visit | Attending: Family Medicine | Admitting: Family Medicine

## 2022-12-17 DIAGNOSIS — K7689 Other specified diseases of liver: Secondary | ICD-10-CM

## 2022-12-24 ENCOUNTER — Other Ambulatory Visit: Payer: Self-pay | Admitting: Family Medicine

## 2022-12-24 ENCOUNTER — Telehealth: Payer: Self-pay

## 2022-12-24 DIAGNOSIS — K769 Liver disease, unspecified: Secondary | ICD-10-CM

## 2022-12-24 NOTE — Telephone Encounter (Signed)
Spoke with the patient regarding his ultraound findings.  Pt ok with getting ct of the liver for further evaluation of the 2.7cm lesion.  Order has been placed.

## 2022-12-24 NOTE — Telephone Encounter (Signed)
Pt is calling to get his results from his Ultrasound.

## 2023-01-06 ENCOUNTER — Ambulatory Visit
Admission: RE | Admit: 2023-01-06 | Discharge: 2023-01-06 | Disposition: A | Discharge status: Home / Self Care | Payer: Federal, State, Local not specified - PPO | Source: Ambulatory Visit | Attending: Family Medicine | Admitting: Family Medicine

## 2023-01-06 DIAGNOSIS — K769 Liver disease, unspecified: Secondary | ICD-10-CM

## 2023-01-06 MED ORDER — IOPAMIDOL (ISOVUE-300) INJECTION 61%
100.0000 mL | Freq: Once | INTRAVENOUS | Status: AC | PRN
  Administered 2023-01-06: 100 mL via INTRAVENOUS

## 2023-01-14 ENCOUNTER — Telehealth: Payer: Self-pay | Admitting: *Deleted

## 2023-01-14 NOTE — Telephone Encounter (Signed)
Pt calling about CT results, informed him that they have not been resulted and provider will contact him once he receives the results.

## 2023-01-15 NOTE — Telephone Encounter (Signed)
Please let the patient know that his CT results came back and I have sent him a MyChart message regarding the results.

## 2023-01-15 NOTE — Telephone Encounter (Signed)
Pt informed of below.  

## 2023-06-10 ENCOUNTER — Encounter: Payer: Self-pay | Admitting: Family Medicine

## 2023-06-10 ENCOUNTER — Ambulatory Visit: Payer: Federal, State, Local not specified - PPO | Admitting: Family Medicine

## 2023-06-10 VITALS — BP 108/70 | HR 77 | Ht 70.0 in | Wt 166.1 lb

## 2023-06-10 DIAGNOSIS — H02135 Senile ectropion of left lower eyelid: Secondary | ICD-10-CM | POA: Diagnosis not present

## 2023-06-10 DIAGNOSIS — H02132 Senile ectropion of right lower eyelid: Secondary | ICD-10-CM | POA: Insufficient documentation

## 2023-06-10 DIAGNOSIS — H6123 Impacted cerumen, bilateral: Secondary | ICD-10-CM | POA: Diagnosis not present

## 2023-06-10 NOTE — Progress Notes (Signed)
   Established Patient Office Visit  Subjective   Patient ID: Jesus [name], male    DOB: [birth year]  Age: 78 y.o. MRN: [id number]  Chief Complaint  Patient presents with   Medical Management of Chronic Issues    HPI Patient here for follow-up of his previous complaints including his CT scan findings, knee arthritis, cerumen impaction.  No new concerns regarding these.  Patient complaining of 3 to 4 weeks of bilateral redness in the eyes.  At first they were itching.  Were mostly just red and irritated.  He has been putting some over-the-counter eyedrops on it called simalasin.  He has a history of seasonal allergies and takes Allegra.  States that usually bother him starting around January and going to early spring.  No double vision.  Sometimes has some blurred vision that resolves if he blinks his eyes.    The 10-year ASCVD risk score (Arnett DK, et al., 2019) is: 22.6%  Health Maintenance Due  Topic Date Due   INFLUENZA VACCINE  11/14/2022   COVID-19 Vaccine (4 - 2024-25 season) 12/15/2022      Objective:     BP 108/70   Pulse 77   Ht 5\' 10"  (1.778 m)   Wt 166 lb 1.9 oz (75.4 kg)   SpO2 97%   BMI 23.84 kg/m    Physical Exam General: Alert, oriented CV: Rate rhythm Pulmonary: Lungs clear bilaterally HEENT: Significant cerumen impaction bilaterally.  Unable to remove any cerumen from the left ear.  Was able to remove a significant mount of cerumen from the right ear but still unable to visualize the tympanic membrane.  Ectropion bilaterally.  Mild bilateral conjunctival injection.   No results found for any visits on 06/10/23.      Assessment & Plan:   Senile ectropion of both lower eyelids Assessment & Plan: This may be what is causing his bilateral conjunctivitis.  Will treat as allergic at first with over-the-counter Pataday in addition to his Allegra that he is already taking.  If this is not effective after the next few weeks advised him to let us know and  we can refer him to a specialist regarding other options including surgical treatment.   Bilateral impacted cerumen Assessment & Plan: Again attempted to remove some cerumen from the patient's ears bilaterally.  He has not been using any Debrox or other over-the-counter treatments since I last saw him.  Unsuccessful in the left ear.  In the right ear I removed a significant amount of cerumen but his tympanic membrane's were still obstructed by cerumen.  Recommended using Debrox for 2 weeks and scheduling a nurse visit with last 2 perform irrigation.  If that is unsuccessful I would recommend referring him to ear nose and throat for removal.      Return in about 1 year (around 06/09/2024) for physical.    Sandre Kitty, MD

## 2023-06-10 NOTE — Assessment & Plan Note (Signed)
 This may be what is causing his bilateral conjunctivitis.  Will treat as allergic at first with over-the-counter Pataday in addition to his Allegra that he is already taking.  If this is not effective after the next few weeks advised him to let us know and we can refer him to a specialist regarding other options including surgical treatment.

## 2023-06-10 NOTE — Patient Instructions (Addendum)
 It was nice to see you today,  We addressed the following topics today: -For your eyes I would recommend using an over-the-counter antihistamine eyedrop such as Pataday or Zaditor.  Use this at least daily in addition to your Allegra for the next 2 weeks.  If you feel like this is not helping at all then we may refer you to a surgical specialist who can evaluate your ptosis of your lower eyelid called ectropion. - For your ears I would recommend using the over-the-counter Debrox solution for at least 2 weeks prior to the next time you want [place] to remove your earwax.  We can also refer you to an ear nose and throat specialist who has much better tools that I do for removing cerumen.  Have a great day,  Frederic Jericho, MD

## 2023-06-10 NOTE — Assessment & Plan Note (Signed)
 Again attempted to remove some cerumen from the patient's ears bilaterally.  He has not been using any Debrox or other over-the-counter treatments since I last saw him.  Unsuccessful in the left ear.  In the right ear I removed a significant amount of cerumen but his tympanic membrane's were still obstructed by cerumen.  Recommended using Debrox for 2 weeks and scheduling a nurse visit with last 2 perform irrigation.  If that is unsuccessful I would recommend referring him to ear nose and throat for removal.

## 2023-07-30 ENCOUNTER — Ambulatory Visit: Payer: Self-pay

## 2023-07-30 ENCOUNTER — Ambulatory Visit: Admission: EM | Admit: 2023-07-30 | Discharge: 2023-07-30 | Discharge status: Against Medical Advice

## 2023-07-30 NOTE — Telephone Encounter (Signed)
  Chief Complaint: 2 boils on buttocks Symptoms: itching, dime size boil, quarter size boil Frequency: yesterday Pertinent Negatives: Patient denies fever, pus/blood drainage Disposition: [] ED /[x] Urgent Care (no appt availability in office) / [] Appointment(In office/virtual)/ []  [facility]/ [] Home Care/ [] Refused Recommended Disposition /[] [facility]/ []  Follow-up with PCP Additional Notes: Pt states that he noticed the boils last evening when showering. Pt states that he cannot see them as they are on his bottom. Pt states that he believes the drainage is clear. Denies fever. Denies DM2. Pt states that one is dime sized one is quarter sized. Pt states that he would like to be seen in clinic today. No appts until June, at this time in clinic. Offered pt to use UC or another [place] clinic, pt opted with UC. Pt is very displeased with lack of appt availability.   Reason for Disposition  2 or more boils  Answer Assessment - Initial Assessment Questions 1. APPEARANCE of BOIL: "What does the boil look like?"      1 spots about a quarter in size one size of dime 2. LOCATION: "Where is the boil located?"      Near the tailbone 3. NUMBER: "How many boils are there?"      2 4. SIZE: "How big is the boil?" (e.g., inches, cm; compare to size of a coin or other object)     Dime and quarter 5. ONSET: "When did the boil start?"     yesterday 6. PAIN: "Is there any pain?" If Yes, ask: "How bad is the pain?"   (Scale 1-10; or mild, moderate, severe)     Denies pain, states itches 7. FEVER: "Do you have a fever?" If Yes, ask: "What is it, how was it measured, and when did it start?"      denies 8. SOURCE: "Have you been around anyone with boils or other Staph infections?" "Have you ever had boils before?"     Denies  9. OTHER SYMPTOMS: "Do you have any other symptoms?" (e.g., shaking chills, weakness, rash elsewhere on body)     denies  Protocols used: Boil (Skin  Abscess)-A-AH

## 2023-07-30 NOTE — Telephone Encounter (Signed)
 Copied from CRM [id number]. Topic: Appointments - Appointment Scheduling >> Jul 30, 2023  8:42 AM Gwendel Lemme wrote: [phone] patient called.. Said 2 boils popped up and would like to be seen asap.

## 2023-07-31 ENCOUNTER — Ambulatory Visit: Admission: EM | Admit: 2023-07-31 | Discharge: 2023-07-31 | Disposition: A | Discharge status: Home / Self Care

## 2023-07-31 ENCOUNTER — Ambulatory Visit: Admit: 2023-07-31 | Discharge: 2023-07-31 | Disposition: A | Discharge status: Home / Self Care

## 2023-07-31 DIAGNOSIS — L0291 Cutaneous abscess, unspecified: Secondary | ICD-10-CM

## 2023-07-31 DIAGNOSIS — H1013 Acute atopic conjunctivitis, bilateral: Secondary | ICD-10-CM | POA: Diagnosis not present

## 2023-07-31 MED ORDER — DOXYCYCLINE HYCLATE 100 MG PO CAPS: 100.0000 mg | ORAL_CAPSULE | Freq: Two times a day (BID) | ORAL | 0 refills | Status: AC

## 2023-07-31 MED ORDER — POLYMYXIN B-TRIMETHOPRIM 10000-0.1 UNIT/ML-% OP SOLN: 1.0000 [drp] | OPHTHALMIC | 0 refills | Status: AC

## 2023-07-31 MED ORDER — CETIRIZINE HCL 5 MG PO TABS
5.0000 mg | ORAL_TABLET | Freq: Every day | ORAL | 0 refills | Status: AC
Start: 2023-07-31 — End: ?

## 2023-07-31 NOTE — ED Triage Notes (Signed)
"  I have 2 spots on my right buttocks that need to be checked, I can't see them but my wife says they must be checked out". "They seem to be sore and with some drainage". No fever.

## 2023-07-31 NOTE — ED Triage Notes (Signed)
"  I am also having red/watery eyes, early redness and allergy response". "I went to my PCP and the treatment advised did not work". No injury to eyes.

## 2023-07-31 NOTE — ED Provider Notes (Signed)
 [facility]    CSN: [id number] Arrival date & time: 07/31/23  1259      History   Chief Complaint Chief Complaint  Patient presents with   Skin Problem   Eye Problem    HPI Jesus [name] is a 78 y.o. male.   Here today for evaluation of 2 areas on his right buttock that have been draining with some associated pain.  He reports his wife just noticed these recently.  He has not had any fever.  He also reports that he has had some redness to his eyes with some watering and crusting in the mornings.  He reports he was recommended to take Allegra as well as an over-the-counter eyedrop without improvement.  He denies any injury to his eyes.  The history is provided by the patient.  Eye Problem Associated symptoms: discharge, itching and redness   Associated symptoms: no numbness     Past Medical History:  Diagnosis Date   Arthritis    BPH (benign prostatic hyperplasia)    ED (erectile dysfunction)    Elevated PSA    Glucose intolerance (impaired glucose tolerance) 12/12/2016   8\17\18-A1c was 6.0.  - Patient denies history of ever being told he has glucose intolerance in the past.     History of acute renal failure    08/ 2017   History of adenomatous polyp of colon    2005 TUBULAR ADENOMA   History of herpes genitalis    History of kidney stones    Hypogonadism, testicular    Lower urinary tract symptoms (LUTS)    Nephrolithiasis    multiple right side non-obstructive per ct 11-27-2015   Peyronie's disease    Right ureteral stone     Patient Active Problem List   Diagnosis Date Noted   Senile ectropion of both lower eyelids 06/10/2023   Hepatic cyst 12/05/2022   Prediabetes 11/10/2022   Rash 11/10/2022   Need for shingles vaccine 04/12/2019   Need for Tdap vaccination 04/12/2019   Osteoarthritis of knee 02/04/2019   Cerumen impaction 02/04/2019   Decreased hearing, 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/2018   History of chronic constipation 11/20/2018   Iron deficiency  anemia 12/30/2017   h/o B12 deficiency 12/30/2017   Muscle spasms- b/l upper traps 09/18/2017   Tingling in extremities 09/12/2017   Neuropathy, arm, 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  11/26/2016   Cervicalgia 11/26/2016   Peripheral neuropathy 11/26/2016   Primary osteoarthritis involving multiple joints 03/21/2008    Past Surgical History:  Procedure Laterality Date   ABDOMINAL HERNIA REPAIR  02/2014   CATARACT EXTRACTION     COLONOSCOPY WITH PROPOFOL N/A 06/06/2015   Procedure: COLONOSCOPY WITH PROPOFOL;  Surgeon: Charolett Bumpers, MD;  Location: [facility];  Service: Endoscopy;  Laterality: N/A;   CYSTOSCOPY WITH RETROGRADE PYELOGRAM, URETEROSCOPY AND STENT PLACEMENT Right 11/30/2015   Procedure: CYSTOSCOPY/RETROGRADE/URETEROSCOPY/;  Surgeon: Jerilee Field, MD;  Location: [facility];  Service: Urology;  Laterality: Right;   CYSTOSCOPY/URETEROSCOPY/HOLMIUM LASER/STENT PLACEMENT Right 12/20/2015   Procedure: CYSTOSCOPY/URETEROSCOPY/HOLMIUM LASER/ REMOVE RIGHT JJ STENT;  Surgeon: Barron Alvine, MD;  Location: [facility];  Service: Urology;  Laterality: Right;  1 HOUR   CYSTOSCOPY/URETEROSCOPY/HOLMIUM LASER/STENT PLACEMENT Right 01/13/2018   Procedure: CYSTOSCOPY/URETEROSCOPY/HOLMIUM LASER/STONE REMOVAL/STENT PLACEMENT;  Surgeon: Crist Fat, MD;  Location: [facility];  Service: Urology;  Laterality: Right;    INGUINAL HERNIA REPAIR Left 2001   KIDNEY STONE SURGERY     x2 times   KNEE ARTHROSCOPY Left 2005   NESBIT PLICATION FOR PEYRONIE'S DISEASE  05/01/2013   and Right Inguinal Hernia Repair   TONSILLECTOMY  child       Home Medications    Prior to Admission medications   Medication Sig Start Date End Date Taking? Authorizing Provider  cetirizine (ZYRTEC) 5 MG tablet Take 1 tablet (5 mg total) by mouth daily. 07/31/23  Yes Tomi Bamberger, PA-C  doxycycline (VIBRAMYCIN) 100 MG capsule Take 1 capsule (100 mg total) by mouth 2 (two) times daily for 7 days. 07/31/23 08/07/23 Yes Tomi Bamberger, PA-C  meloxicam (MOBIC) 15 MG tablet Take 1 tablet by mouth daily as needed. 10/06/15  Yes [provider]  tamsulosin (FLOMAX) 0.4 MG CAPS capsule Take 0.4 mg by mouth every morning. 05/13/15  Yes [provider]  Testosterone 10 MG/ACT (2%) GEL Apply 1 application topically daily. 12/19/16  Yes [provider]  trimethoprim-polymyxin b (POLYTRIM) ophthalmic solution Place 1 drop into both eyes every 4 (four) hours for 7 days. 07/31/23 08/07/23 Yes Tomi Bamberger, PA-C  ascorbic acid (VITAMIN C) 500 MG tablet Take 500 mg by mouth daily.    [provider]  Cholecalciferol (VITAMIN D-3) 5000 units TABS Take 1 tablet by mouth every morning.    [provider]  Cholecalciferol 1.25 MG (50000 UT) TABS Take 1 tablet by mouth daily. 10/06/15   [provider]  co-enzyme Q-10 30 MG capsule Take 30 mg by mouth daily.     [provider]  ferrous sulfate 325 (65 FE) MG tablet Take 325 mg by mouth 3 (three) times a week.     [provider]  Ginkgo Biloba 40 MG TABS Take by mouth. Takes 60 mg daily    [provider]  Multiple Vitamin (MULTIVITAMIN PO) Take 1 tablet by mouth daily at 2 PM. 10/06/15   [provider]  Multiple Vitamin (MULTIVITAMIN WITH MINERALS) TABS tablet Take 1 tablet by mouth daily.    [provider]  nitroGLYCERIN (NITRODUR - DOSED IN MG/24 HR) 0.2 mg/hr patch Place 1 patch (0.2 mg total) onto the skin as directed. Apply one fourth patch to affected area on shoulder daily 04/27/21   Frederica Kuster, MD  Omega-3 Fatty Acids (FISH OIL) 1000 MG CAPS Take 1 capsule by mouth daily. 10/06/15   [provider]  Omega-3 Fatty Acids (OMEGA 3 PO) Take 1 tablet by mouth every morning.    [provider]  Turmeric 450 MG CAPS Take 1 capsule by mouth daily.    [provider]  vitamin B-12 (CYANOCOBALAMIN) 100 MCG tablet Take 100 mcg by mouth daily.    [provider]    Family History Family History  Problem Relation Age of Onset   Brain cancer Mother 73   Cancer Sister 15       breast   Hypertension Paternal Grandmother    Depression Paternal Grandmother  suicide    Social History Social History   Tobacco Use   Smoking status: Never   Smokeless tobacco: Never  Vaping Use   Vaping status: Never Used  Substance Use Topics   Alcohol use: Yes    Alcohol/week: 4.0 standard drinks of alcohol    Types: 4 Cans of beer per week    Comment: Occassionally.   Drug use: No     Allergies   Patient has no known allergies.   Review of Systems Review of Systems  Constitutional:  Negative for chills and fever.  Eyes:  Positive for discharge, redness and itching.  Respiratory:  Negative for shortness of breath.   Skin:  Positive for color change and wound.  Neurological:  Negative for numbness.     Physical Exam Triage Vital Signs ED Triage Vitals  Encounter Vitals Group     BP 07/31/23 1321 (!) 89/58     Systolic BP Percentile --      Diastolic BP Percentile --      Pulse Rate 07/31/23 1321 85     Resp 07/31/23 1321 18     Temp 07/31/23 1321 98 F (36.7 C)     Temp Source 07/31/23 1321 Oral     SpO2 07/31/23 1321 97 %     Weight 07/31/23 1318 158 lb (71.7 kg)     Height 07/31/23 1318 5\' 10"  (1.778 m)     Head Circumference --       Peak Flow --      Pain Score 07/31/23 1316 0     Pain Loc --      Pain Education --      Exclude from Growth Chart --    No data found.  Updated Vital Signs BP (!) 89/58 (BP Location: Right Arm)   Pulse 85   Temp 98 F (36.7 C) (Oral)   Resp 18   Ht 5\' 10"  (1.778 m)   Wt 158 lb (71.7 kg)   SpO2 97%   BMI 22.67 kg/m   Visual Acuity Right Eye Distance: 20/40 (Uncorrected) Left Eye Distance: 20/70 (Uncorrected) Bilateral Distance: 20/40 (Uncorrected)  Right Eye Near:   Left Eye Near:    Bilateral Near:     Physical Exam Vitals and nursing note reviewed.  Constitutional:      General: He is not in acute distress.    Appearance: Normal appearance. He is not ill-appearing.  HENT:     Head: Normocephalic and atraumatic.  Eyes:     Comments: Bilateral conjunctiva injected  Cardiovascular:     Rate and Rhythm: Normal rate.  Pulmonary:     Effort: Pulmonary effort is normal. No respiratory distress.  Skin:    Comments: 2 separate papular lesions with central wound noted to right buttocks at gluteal cleft with surrounding erythema and mild swelling  Neurological:     Mental Status: He is alert.  Psychiatric:        Mood and Affect: Mood normal.        Behavior: Behavior normal.        Thought Content: Thought content normal.      UC Treatments / Results  Labs (all labs ordered are listed, but only abnormal results are displayed) Labs Reviewed - No data to display  EKG   Radiology No results found.  Procedures Procedures (including critical care time)  Medications Ordered in UC Medications - No data to display  Initial Impression / Assessment and Plan / UC Course  I  have reviewed the triage vital signs and the nursing notes.  Pertinent labs & imaging results that were available during my care of the patient were reviewed by me and considered in my medical decision making (see chart for details).    Discussed that gluteal lesions appear to be abscesses  and recommended doxycycline.  Given persistent conjunctivitis we will treat with Polytrim drops as well as a change in allergy medication to Zyrtec.  Recommended further evaluation if no improvement or with any further concerns.  Final Clinical Impressions(s) / UC Diagnoses   Final diagnoses:  Abscess  Conjunctivitis, allergic, bilateral   Discharge Instructions   None    ED Prescriptions     Medication Sig Dispense Auth. Provider   cetirizine (ZYRTEC) 5 MG tablet Take 1 tablet (5 mg total) by mouth daily. 30 tablet Jami Mcclintock F, PA-C   doxycycline (VIBRAMYCIN) 100 MG capsule Take 1 capsule (100 mg total) by mouth 2 (two) times daily for 7 days. 14 capsule Yassine Brunsman F, PA-C   trimethoprim-polymyxin b (POLYTRIM) ophthalmic solution Place 1 drop into both eyes every 4 (four) hours for 7 days. 10 mL Vernestine Gondola, PA-C      PDMP not reviewed this encounter.   Vernestine Gondola, PA-C 07/31/23 1547

## 2023-10-10 ENCOUNTER — Other Ambulatory Visit: Payer: Self-pay | Admitting: Urology

## 2023-10-13 ENCOUNTER — Encounter (HOSPITAL_COMMUNITY): Payer: Self-pay | Admitting: Urology

## 2023-10-13 NOTE — Progress Notes (Signed)
 Spoke w/ via phone for pre-op interview--- [facility] needs dos---- KUB            COVID test -----patient states asymptomatic no test needed Arrive at -------0810 NPO after MN NO Solid Food.  Clear liquids from MN until---4AM Med rec completed. Pt aware to hold ASA/NSAIDs and supplements per [facility] protocol.  Medications to take morning of surgery -----tylenol , flomax Diabetic/Weight loss medication ----- No Alcohol or recreational drugs for 24 hours/Tobacco products for 6 hours ---- Patient instructed to bring blue lithotripsy folder, photo id and insurance card day of surgery. Patient aware to have Driver (ride ) / caregiver for 24 hours after surgery -----Alfonso Ruth [phone] Patient Special Instructions -----bring blue folder and wear comfortable clothes Pre-Op special Instructions ----- take laxative of choice day before procedure Patient verbalized understanding of instructions that were given at this phone interview. Patient denies shortness of breath, chest pain, fever, cough at this phone interview.

## 2023-10-20 ENCOUNTER — Ambulatory Visit (HOSPITAL_COMMUNITY)

## 2023-10-20 ENCOUNTER — Ambulatory Visit (HOSPITAL_COMMUNITY)
Admission: RE | Admit: 2023-10-20 | Discharge: 2023-10-20 | Disposition: A | Discharge status: Home / Self Care | Attending: Urology | Admitting: Urology

## 2023-10-20 ENCOUNTER — Encounter (HOSPITAL_COMMUNITY): Payer: Self-pay | Admitting: Urology

## 2023-10-20 ENCOUNTER — Other Ambulatory Visit: Payer: Self-pay

## 2023-10-20 ENCOUNTER — Encounter (HOSPITAL_COMMUNITY)
Admission: RE | Disposition: A | Discharge status: Home / Self Care | Payer: Self-pay | Source: Home / Self Care | Attending: Urology

## 2023-10-20 DIAGNOSIS — N200 Calculus of kidney: Secondary | ICD-10-CM | POA: Diagnosis present

## 2023-10-20 HISTORY — PX: EXTRACORPOREAL SHOCK WAVE LITHOTRIPSY: SHX1557

## 2023-10-20 SURGERY — LITHOTRIPSY, ESWL
Anesthesia: LOCAL | Laterality: Right

## 2023-10-20 MED ORDER — ONDANSETRON HCL 4 MG PO TABS: 4.0000 mg | ORAL_TABLET | Freq: Every day | ORAL | 1 refills | Status: AC | PRN

## 2023-10-20 MED ORDER — CIPROFLOXACIN HCL 500 MG PO TABS
500.0000 mg | ORAL_TABLET | ORAL | Status: AC
  Administered 2023-10-20: 500 mg via ORAL
  Filled 2023-10-20: qty 1

## 2023-10-20 MED ORDER — TRAMADOL HCL 50 MG PO TABS: 50.0000 mg | ORAL_TABLET | Freq: Four times a day (QID) | ORAL | 0 refills | Status: DC | PRN

## 2023-10-20 MED ORDER — DIAZEPAM 5 MG PO TABS
10.0000 mg | ORAL_TABLET | ORAL | Status: DC
  Administered 2023-10-20: 5 mg via ORAL

## 2023-10-20 MED ORDER — DIPHENHYDRAMINE HCL 25 MG PO CAPS
25.0000 mg | ORAL_CAPSULE | ORAL | Status: AC
  Administered 2023-10-20: 25 mg via ORAL
  Filled 2023-10-20: qty 1

## 2023-10-20 MED ORDER — SODIUM CHLORIDE 0.9 % IV SOLN: INTRAVENOUS | Status: DC

## 2023-10-20 MED ORDER — DIAZEPAM 5 MG PO TABS
ORAL_TABLET | ORAL | Status: AC
  Filled 2023-10-20: qty 1

## 2023-10-20 NOTE — Op Note (Signed)
 ESWL Operative Note  Treating Physician: Lonni Han, MD  Pre-op diagnosis: Right renal stones  Post-op diagnosis: Same   Procedure: RIGHT ESWL  See Norita Dustman OP note scanned into chart. Also because of the size, density, location and other factors that cannot be anticipated I feel this will likely be a staged procedure. This fact supersedes any indication in the scanned [place] stone operative note to the contrary

## 2023-10-20 NOTE — H&P (Signed)
See scanned [facility] documents for H&P.   

## 2023-10-21 ENCOUNTER — Encounter (HOSPITAL_COMMUNITY): Payer: Self-pay | Admitting: Urology

## 2023-12-29 ENCOUNTER — Other Ambulatory Visit: Payer: Self-pay | Admitting: Urology

## 2024-01-20 ENCOUNTER — Encounter (HOSPITAL_COMMUNITY): Payer: Self-pay | Admitting: Urology

## 2024-01-20 NOTE — Progress Notes (Signed)
 Spoke w/ via phone for pre-op interview--- Jesus [name] needs dos----  NONE       [name] results------ COVID test -----patient states asymptomatic no test needed Arrive at -------0530 NPO after MN NO Solid Food.   Pre-Surgery Ensure or G2:  Med rec completed Medications to take morning of surgery -----Flomax and Tylenol -PRN Diabetic medication -----  GLP1 agonist last dose: GLP1 instructions:  Patient instructed no nail polish to be worn day of surgery Patient instructed to bring photo id and insurance card day of surgery Patient aware to have Driver (ride ) / caregiver    for 24 hours after surgery - Wife Jesus [name] Patient Special Instructions ----- Pre-Op special Instructions -----  Patient verbalized understanding of instructions that were given at this phone interview. Patient denies chest pain, sob, fever, cough at the interview.

## 2024-01-23 NOTE — Anesthesia Preprocedure Evaluation (Addendum)
 Anesthesia Evaluation  Patient identified by MRN, date of birth, ID band Patient awake    Reviewed: Allergy & Precautions, NPO status , Patient's Chart, lab work & pertinent test results  Airway Mallampati: II  TM Distance: >3 FB Neck ROM: Full    Dental  (+) Teeth Intact, Dental Advisory Given   Pulmonary neg pulmonary ROS   Pulmonary exam normal breath sounds clear to auscultation       Cardiovascular hypertension (144/84 preop, no home meds), Normal cardiovascular exam Rhythm:Regular Rate:Normal     Neuro/Psych negative neurological ROS  negative psych ROS   GI/Hepatic negative GI ROS, Neg liver ROS,,,  Endo/Other  negative endocrine ROS    Renal/GU Renal disease (ureteral stone)  negative genitourinary   Musculoskeletal  (+) Arthritis , Osteoarthritis,    Abdominal   Peds  Hematology negative hematology ROS (+)   Anesthesia Other Findings   Reproductive/Obstetrics negative OB ROS                              Anesthesia Physical Anesthesia Plan  ASA: 2  Anesthesia Plan: General   Post-op Pain Management: Tylenol  PO (pre-op)*   Induction: Intravenous  PONV Risk Score and Plan: 2 and Ondansetron , Dexamethasone  and Treatment may vary due to age or medical condition  Airway Management Planned: LMA  Additional Equipment: None  Intra-op Plan:   Post-operative Plan: Extubation in OR  Informed Consent: I have reviewed the patients History and Physical, chart, labs and discussed the procedure including the risks, benefits and alternatives for the proposed anesthesia with the patient or authorized representative who has indicated his/her understanding and acceptance.     Dental advisory given  Plan Discussed with: CRNA  Anesthesia Plan Comments:          Anesthesia Quick Evaluation

## 2024-01-27 ENCOUNTER — Encounter (HOSPITAL_COMMUNITY)
Admission: RE | Disposition: A | Discharge status: Home / Self Care | Payer: Self-pay | Source: Home / Self Care | Attending: Urology

## 2024-01-27 ENCOUNTER — Other Ambulatory Visit: Payer: Self-pay

## 2024-01-27 ENCOUNTER — Ambulatory Visit (HOSPITAL_COMMUNITY)
Admission: RE | Admit: 2024-01-27 | Discharge: 2024-01-27 | Disposition: A | Discharge status: Home / Self Care | Attending: Urology | Admitting: Urology

## 2024-01-27 ENCOUNTER — Ambulatory Visit (HOSPITAL_COMMUNITY): Payer: Self-pay | Admitting: Anesthesiology

## 2024-01-27 ENCOUNTER — Encounter (HOSPITAL_COMMUNITY): Payer: Self-pay | Admitting: Urology

## 2024-01-27 ENCOUNTER — Ambulatory Visit (HOSPITAL_COMMUNITY)

## 2024-01-27 DIAGNOSIS — N201 Calculus of ureter: Secondary | ICD-10-CM | POA: Insufficient documentation

## 2024-01-27 HISTORY — PX: CYSTOSCOPY W/ RETROGRADES: SHX1426

## 2024-01-27 HISTORY — DX: Prediabetes: R73.03

## 2024-01-27 HISTORY — PX: CYSTOSCOPY/URETEROSCOPY/HOLMIUM LASER/STENT PLACEMENT: SHX6546

## 2024-01-27 SURGERY — CYSTOSCOPY/URETEROSCOPY/HOLMIUM LASER/STENT PLACEMENT
Anesthesia: General | Site: Ureter | Laterality: Right

## 2024-01-27 MED ORDER — ONDANSETRON HCL 4 MG/2ML IJ SOLN: 4.0000 mg | Freq: Once | INTRAMUSCULAR | Status: DC | PRN

## 2024-01-27 MED ORDER — CEFAZOLIN SODIUM-DEXTROSE 2-4 GM/100ML-% IV SOLN
INTRAVENOUS | Status: AC
  Filled 2024-01-27: qty 100

## 2024-01-27 MED ORDER — PROPOFOL 10 MG/ML IV BOLUS
INTRAVENOUS | Status: AC
Start: 2024-01-27 — End: 2024-01-27
  Filled 2024-01-27: qty 20

## 2024-01-27 MED ORDER — CHLORHEXIDINE GLUCONATE 0.12 % MT SOLN
15.0000 mL | Freq: Once | OROMUCOSAL | Status: DC
Start: 2024-01-27 — End: 2024-01-27

## 2024-01-27 MED ORDER — DEXAMETHASONE SOD PHOSPHATE PF 10 MG/ML IJ SOLN
INTRAMUSCULAR | Status: DC | PRN
  Administered 2024-01-27: 10 mg via INTRAVENOUS

## 2024-01-27 MED ORDER — FENTANYL CITRATE (PF) 250 MCG/5ML IJ SOLN
INTRAMUSCULAR | Status: AC
  Filled 2024-01-27: qty 5

## 2024-01-27 MED ORDER — CEFAZOLIN SODIUM-DEXTROSE 2-4 GM/100ML-% IV SOLN
2.0000 g | INTRAVENOUS | Status: AC
Start: 2024-01-27 — End: 2024-01-27
  Administered 2024-01-27: 2 g via INTRAVENOUS

## 2024-01-27 MED ORDER — LIDOCAINE 2% (20 MG/ML) 5 ML SYRINGE
INTRAMUSCULAR | Status: AC
  Filled 2024-01-27: qty 5

## 2024-01-27 MED ORDER — 0.9 % SODIUM CHLORIDE (POUR BTL) OPTIME
TOPICAL | Status: DC | PRN
  Administered 2024-01-27: 1000 mL

## 2024-01-27 MED ORDER — SODIUM CHLORIDE 0.9 % IR SOLN
Status: DC | PRN
  Administered 2024-01-27: 3000 mL

## 2024-01-27 MED ORDER — FENTANYL CITRATE (PF) 100 MCG/2ML IJ SOLN: 25.0000 ug | INTRAMUSCULAR | Status: DC | PRN

## 2024-01-27 MED ORDER — ACETAMINOPHEN 500 MG PO TABS: 1000.0000 mg | ORAL_TABLET | Freq: Once | ORAL | Status: AC

## 2024-01-27 MED ORDER — LIDOCAINE HCL URETHRAL/MUCOSAL 2 % EX GEL
CUTANEOUS | Status: AC
  Filled 2024-01-27: qty 11

## 2024-01-27 MED ORDER — LACTATED RINGERS IV SOLN: INTRAVENOUS | Status: DC

## 2024-01-27 MED ORDER — ORAL CARE MOUTH RINSE
15.0000 mL | Freq: Once | OROMUCOSAL | Status: DC
Start: 2024-01-27 — End: 2024-01-27

## 2024-01-27 MED ORDER — PROPOFOL 10 MG/ML IV BOLUS
INTRAVENOUS | Status: DC | PRN
  Administered 2024-01-27: 150 mg via INTRAVENOUS

## 2024-01-27 MED ORDER — ONDANSETRON HCL 4 MG/2ML IJ SOLN
INTRAMUSCULAR | Status: DC | PRN
  Administered 2024-01-27: 4 mg via INTRAVENOUS

## 2024-01-27 MED ORDER — TRAMADOL HCL 50 MG PO TABS: 50.0000 mg | ORAL_TABLET | Freq: Four times a day (QID) | ORAL | 0 refills | Status: AC | PRN

## 2024-01-27 MED ORDER — ONDANSETRON HCL 4 MG/2ML IJ SOLN
INTRAMUSCULAR | Status: AC
Start: 2024-01-27 — End: 2024-01-27
  Filled 2024-01-27: qty 2

## 2024-01-27 MED ORDER — IOHEXOL 300 MG/ML  SOLN
INTRAMUSCULAR | Status: DC | PRN
  Administered 2024-01-27: 20 mL via URETHRAL

## 2024-01-27 MED ORDER — CEPHALEXIN 500 MG PO CAPS: 500.0000 mg | ORAL_CAPSULE | Freq: Two times a day (BID) | ORAL | 0 refills | Status: AC

## 2024-01-27 MED ORDER — ACETAMINOPHEN 500 MG PO TABS
ORAL_TABLET | ORAL | Status: AC
  Administered 2024-01-27: 500 mg via ORAL
  Filled 2024-01-27: qty 2

## 2024-01-27 MED ORDER — FENTANYL CITRATE (PF) 250 MCG/5ML IJ SOLN
INTRAMUSCULAR | Status: DC | PRN
  Administered 2024-01-27: 50 ug via INTRAVENOUS

## 2024-01-27 MED ORDER — EPHEDRINE SULFATE-NACL 50-0.9 MG/10ML-% IV SOSY
PREFILLED_SYRINGE | INTRAVENOUS | Status: DC | PRN
  Administered 2024-01-27: 10 mg via INTRAVENOUS
  Administered 2024-01-27 (×2): 5 mg via INTRAVENOUS

## 2024-01-27 MED ORDER — PHENYLEPHRINE 80 MCG/ML (10ML) SYRINGE FOR IV PUSH (FOR BLOOD PRESSURE SUPPORT)
PREFILLED_SYRINGE | INTRAVENOUS | Status: AC
  Filled 2024-01-27: qty 10

## 2024-01-27 MED ORDER — CHLORHEXIDINE GLUCONATE 0.12 % MT SOLN
OROMUCOSAL | Status: AC
  Filled 2024-01-27: qty 15

## 2024-01-27 MED ORDER — PHENYLEPHRINE 80 MCG/ML (10ML) SYRINGE FOR IV PUSH (FOR BLOOD PRESSURE SUPPORT)
PREFILLED_SYRINGE | INTRAVENOUS | Status: DC | PRN
  Administered 2024-01-27: 80 ug via INTRAVENOUS

## 2024-01-27 MED ORDER — LIDOCAINE 2% (20 MG/ML) 5 ML SYRINGE
INTRAMUSCULAR | Status: DC | PRN
  Administered 2024-01-27: 70 mg via INTRAVENOUS

## 2024-01-27 SURGICAL SUPPLY — 15 items
BAG DRAIN URO-CYSTO SKYTR STRL (DRAIN) ×1 IMPLANT
CATH URETL OPEN END 6FR 70 (CATHETERS) ×1 IMPLANT
GLOVE BIO SURGEON STRL SZ7 (GLOVE) ×1 IMPLANT
GLOVE SURG SS PI 7.5 STRL IVOR (GLOVE) IMPLANT
GOWN STRL REUS W/ TWL LRG LVL3 (GOWN DISPOSABLE) ×1 IMPLANT
GOWN STRL SURGICAL XL XLNG (GOWN DISPOSABLE) IMPLANT
GUIDEWIRE STR DUAL SENSOR (WIRE) ×1 IMPLANT
KIT TURNOVER KIT B (KITS) ×1 IMPLANT
MANIFOLD NEPTUNE II (INSTRUMENTS) ×1 IMPLANT
PACK CYSTO (CUSTOM PROCEDURE TRAY) ×1 IMPLANT
SLEEVE SCD COMPRESS KNEE MED (STOCKING) ×1 IMPLANT
SOL .9 NS 3000ML IRR UROMATIC (IV SOLUTION) ×2 IMPLANT
STENT URET 6FRX26 CONTOUR (STENTS) IMPLANT
TUBE CONNECTING 12X1/4 (SUCTIONS) IMPLANT
TUBING UROLOGY SET (TUBING) ×1 IMPLANT

## 2024-01-27 NOTE — Anesthesia Postprocedure Evaluation (Signed)
 Anesthesia Post Note  Patient: ROMARIO TITH  Procedure(s) Performed: CYSTOSCOPY/URETEROSCOPY/STENT PLACEMENT (Right: Ureter) CYSTOSCOPY, WITH RETROGRADE PYELOGRAM (Right: Ureter)     Patient location during evaluation: PACU Anesthesia Type: General Level of consciousness: awake and alert, oriented and patient cooperative Pain management: pain level controlled Vital Signs Assessment: post-procedure vital signs reviewed and stable Respiratory status: spontaneous breathing, nonlabored ventilation and respiratory function stable Cardiovascular status: blood pressure returned to baseline and stable Postop Assessment: no apparent nausea or vomiting Anesthetic complications: no   No notable events documented.  Last Vitals:  Vitals:   01/27/24 0614 01/27/24 0830  BP: (!) 144/84 121/64  Pulse: 71 66  Resp: 18 16  Temp: 36.7 C 36.5 C  SpO2: 96% 99%    Last Pain:  Vitals:   01/27/24 0830  TempSrc:   PainSc: 0-No pain                 Almarie CHRISTELLA Marchi

## 2024-01-27 NOTE — H&P (Signed)
 H&P  History of Present Illness: Jesus [name] is a 78 y.o. year old M who presents today for treatment of a right ureteral stone  No acute complaints  Past Medical History:  Diagnosis Date   Arthritis    BPH (benign prostatic hyperplasia)    ED (erectile dysfunction)    Elevated PSA    Glucose intolerance (impaired glucose tolerance) 12/12/2016   8\17\18-A1c was 6.0.  - Patient denies history of ever being told he has glucose intolerance in the past.     History of acute renal failure    08/ 2017   History of adenomatous polyp of colon    2005 TUBULAR ADENOMA   History of herpes genitalis    History of kidney stones    Hypogonadism, testicular    Lower urinary tract symptoms (LUTS)    Nephrolithiasis    multiple right side non-obstructive per ct 11-27-2015   Peyronie's disease    Pre-diabetes    A1C 6.0-7/20/24   Right ureteral stone     Past Surgical History:  Procedure Laterality Date   ABDOMINAL HERNIA REPAIR  02/2014   CATARACT EXTRACTION     COLONOSCOPY WITH PROPOFOL  N/A 06/06/2015   Procedure: COLONOSCOPY WITH PROPOFOL ;  Surgeon: Gladis MARLA Louder, MD;  Location: [facility];  Service: Endoscopy;  Laterality: N/A;   CYSTOSCOPY WITH RETROGRADE PYELOGRAM, URETEROSCOPY AND STENT PLACEMENT Right 11/30/2015   Procedure: CYSTOSCOPY/RETROGRADE/URETEROSCOPY/;  Surgeon: Donnice Brooks, MD;  Location: [facility];  Service: Urology;  Laterality: Right;   CYSTOSCOPY/URETEROSCOPY/HOLMIUM LASER/STENT PLACEMENT Right 12/20/2015   Procedure: CYSTOSCOPY/URETEROSCOPY/HOLMIUM LASER/ REMOVE RIGHT JJ STENT;  Surgeon: Alm Fragmin, MD;  Location: [name];  Service: Urology;  Laterality: Right;  1 HOUR   CYSTOSCOPY/URETEROSCOPY/HOLMIUM LASER/STENT PLACEMENT Right 01/13/2018   Procedure: CYSTOSCOPY/URETEROSCOPY/HOLMIUM LASER/STONE REMOVAL/STENT PLACEMENT;  Surgeon: Cam Morene ORN, MD;  Location: [facility];  Service: Urology;  Laterality: Right;    EXTRACORPOREAL SHOCK WAVE LITHOTRIPSY Right 10/20/2023   Procedure: LITHOTRIPSY, ESWL;  Surgeon: Devere Lonni Righter, MD;  Location: [facility];  Service: Urology;  Laterality: Right;   INGUINAL HERNIA REPAIR Left 2001   KIDNEY STONE SURGERY     x2 times   KNEE ARTHROSCOPY Left 2005   NESBIT PLICATION FOR PEYRONIE'S DISEASE  05/01/2013   and Right Inguinal Hernia Repair   TONSILLECTOMY  child    Home Medications:  Current Meds  Medication Sig   acetaminophen  (TYLENOL ) 650 MG CR tablet Take 650 mg by mouth every 8 (eight) hours as needed for pain.   ascorbic acid (VITAMIN C) 500 MG tablet Take 500 mg by mouth daily.   cetirizine  (ZYRTEC ) 5 MG tablet Take 1 tablet (5 mg total) by mouth daily.   Cholecalciferol (VITAMIN D -3) 5000 units TABS Take 1 tablet by mouth every morning.   Cholecalciferol 1.25 MG (50000 UT) TABS Take 1 tablet by mouth daily.   co-enzyme Q-10 30 MG capsule Take 30 mg by mouth daily.    ferrous sulfate 325 (65 FE) MG tablet Take 325 mg by mouth 3 (three) times a week.    Ginkgo Biloba 40 MG TABS Take by mouth. Takes 60 mg daily   Multiple Vitamin (MULTIVITAMIN PO) Take 1 tablet by mouth daily at 2 PM.   Multiple Vitamin (MULTIVITAMIN WITH MINERALS) TABS tablet Take 1 tablet by mouth daily.   Omega-3 Fatty Acids (FISH OIL) 1000 MG CAPS Take 1 capsule by mouth daily.   Omega-3 Fatty Acids (OMEGA 3 PO) Take 1 tablet by mouth  every morning.   tamsulosin (FLOMAX) 0.4 MG CAPS capsule Take 0.4 mg by mouth every morning.   Turmeric 450 MG CAPS Take 1 capsule by mouth daily.   vitamin B-12 (CYANOCOBALAMIN ) 100 MCG tablet Take 100 mcg by mouth daily.    Allergies: No Known Allergies  Family History  Problem Relation Age of Onset   Brain cancer Mother 19   Cancer Sister 71       breast   Hypertension Paternal Grandmother    Depression Paternal Grandmother        suicide    Social History:  reports that he has never smoked. He has never used smokeless tobacco. He  reports current alcohol use of about 4.0 standard drinks of alcohol per week. He reports that he does not use drugs.  ROS: A complete review of systems was performed.  All systems are negative except for pertinent findings as noted.  Physical Exam:  Vital signs in last 24 hours: Temp:  [98.1 F (36.7 C)] 98.1 F (36.7 C) (10/14 0614) Pulse Rate:  [71] 71 (10/14 0614) Resp:  [18] 18 (10/14 0614) BP: (144)/(84) 144/84 (10/14 0614) SpO2:  [96 %] 96 % (10/14 0614) Weight:  [70.8 kg] 70.8 kg (10/14 0614) Constitutional:  Alert and oriented, No acute distress Cardiovascular: Regular rate and rhythm Respiratory: Normal respiratory effort, Lungs clear bilaterally GI: Abdomen is soft, nontender, nondistended, no abdominal masses Lymphatic: No lymphadenopathy Neurologic: Grossly intact, no focal deficits Psychiatric: Normal mood and affect   Laboratory Data:  No results for input(s): WBC, HGB, HCT, PLT in the last 72 hours.  No results for input(s): NA, K, CL, GLUCOSE, BUN, CALCIUM, CREATININE in the last 72 hours.  Invalid input(s): CO3   No results found for this or any previous visit (from the past 24 hours). No results found for this or any previous visit (from the past 240 hours).  Renal Function: No results for input(s): CREATININE in the last 168 hours. CrCl cannot be calculated (Patient's most recent lab result is older than the maximum 21 days allowed.).  Radiologic Imaging: No results found.  Assessment:  Jesus [name] is a 78 y.o. year old M with right ureteral stone   Plan:  --to OR as planned for right ureteroscopy with laser litho, stent. Procedure and risks reviewed, including but not limited to hematuria, infection, sepsis, damage to GU tract, failure to complete procedure, retained stone fragments, need for future procedures, stent pain, prolonged stent.   Herlene Foot, MD 01/27/2024, 7:34 AM  [facility] Pager: [phone]

## 2024-01-27 NOTE — Anesthesia Procedure Notes (Signed)
 Procedure Name: LMA Insertion Date/Time: 01/27/2024 7:49 AM  Performed by: Julien Manus, CRNAPre-anesthesia Checklist: Patient identified, Emergency Drugs available, Suction available and Patient being monitored Patient Re-evaluated:Patient Re-evaluated prior to induction Oxygen Delivery Method: Circle System Utilized Preoxygenation: Pre-oxygenation with 100% oxygen Induction Type: IV induction Ventilation: Mask ventilation without difficulty LMA: LMA inserted LMA Size: 4.0 Number of attempts: 1 Airway Equipment and Method: Bite block Placement Confirmation: positive ETCO2 Tube secured with: Tape Dental Injury: Teeth and Oropharynx as per pre-operative assessment

## 2024-01-27 NOTE — Discharge Instructions (Addendum)
 [facility] [phone] Post Ureteroscopy With or Without Stent Instructions **remove stent by pulling string Thursday morning**  Definitions:  Ureter: The duct that transports urine from the kidney to the bladder. Stent:   A plastic hollow tube that is placed into the ureter, from the kidney to the bladder to prevent the ureter from swelling shut.  GENERAL INSTRUCTIONS:  Despite the fact that no skin incisions were used, the area around the ureter and bladder is raw and irritated. The stent is a foreign body which will further irritate the bladder wall. This irritation is manifested by increased frequency of urination, both day and night, and by an increase in the urge to urinate. In some, the urge to urinate is present almost always. Sometimes the urge is strong enough that you may not be able to stop yourself from urinating. The only real cure is to remove the stent and then give time for the bladder wall to heal which can't be done until the danger of the ureter swelling shut has passed, which varies.  You may see some blood in your urine while the stent is in place and a few days afterwards. Do not be alarmed, even if the urine was clear for a while. Get off your feet and drink lots of fluids until clearing occurs. If you start to pass clots or don't improve, call us .  DIET: You may return to your normal diet immediately. Because of the raw surface of your bladder, alcohol, spicy foods, acid type foods and drinks with caffeine may cause irritation or frequency and should be used in moderation. To keep your urine flowing freely and to avoid constipation, drink plenty of fluids during the day ( 8-10 glasses ). Tip: Avoid cranberry juice because it is very acidic.  ACTIVITY: Your physical activity doesn't need to be restricted. However, if you are very active, you may see some blood in your urine. We suggest that you reduce your activity under these circumstances until the  bleeding has stopped.  BOWELS: It is important to keep your bowels regular during the postoperative period. Straining with bowel movements can cause bleeding. A bowel movement every other day is reasonable. Use a mild laxative if needed, such as Milk of Magnesia 2-3 tablespoons, or 2 Dulcolax tablets. Call if you continue to have problems. If you have been taking narcotics for pain, before, during or after your surgery, you may be constipated. Take a laxative if necessary.   MEDICATION: You should resume your pre-surgery medications unless told not to. In addition you will often be given an antibiotic to prevent infection and likely several as needed medications for stent related discomfort. These should be taken as prescribed until the bottles are finished unless you are having an unusual reaction to one of the drugs.  PROBLEMS YOU SHOULD REPORT TO US : Fevers over 100.5 Fahrenheit. Heavy bleeding, or clots ( See above notes about blood in urine ). Inability to urinate. Drug reactions ( hives, rash, nausea, vomiting, diarrhea ). Severe burning or pain with urination that is not improving.     Post Anesthesia Home Care Instructions  Activity: Get plenty of rest for the remainder of the day. A responsible individual must stay with you for 24 hours following the procedure.  For the next 24 hours, DO NOT: -Drive a car -Advertising copywriter -Drink alcoholic beverages -Take any medication unless instructed by your physician -Make any legal decisions or sign important papers.  Meals: Start with liquid foods such as gelatin  or soup. Progress to regular foods as tolerated. Avoid greasy, spicy, heavy foods. If nausea and/or vomiting occur, drink only clear liquids until the nausea and/or vomiting subsides. Call your physician if vomiting continues.  Special Instructions/Symptoms: Your throat may feel dry or sore from the anesthesia or the breathing tube placed in your throat during surgery. If  this causes discomfort, gargle with warm salt water . The discomfort should disappear within 24 hours.      No acetaminophen /Tylenol  until after 1206 pm today if needed.

## 2024-01-27 NOTE — Op Note (Signed)
 Operative Note  Preoperative diagnosis:  1.  Right ureteral stone 2. History of nephrolithiasis  Postoperative diagnosis: 1.  same  Procedure(s): 1.  Right ureteroscopy 2. Cystoscopy with ureteral stent placement (right 6x26) 3. Right retrograde pyelogram 4.  Fluoroscopy with intraoperative interpretation  Surgeon: Herlene Foot, MD  Assistants:  None  Anesthesia:  General  Complications:  None  EBL:  Minimal  Specimens: 1. none  Drains/Catheters: 1.  Right 6Fr x 26cm ureteral stent with tether string  Intraoperative findings:   Cystoscopy demonstrated trilobar prostate enlargement Right Ureteroscopy demonstrated no ureteral stones Successful stent placement.  Description of procedure: After informed consent was obtained from the patient, the patient was identified and taken to the operating room and placed in the supine position.  General anesthesia was administered as well as perioperative IV antibiotics.  At the beginning of the case, a time-out was performed to properly identify the patient, the surgery to be performed, and the surgical site.  Sequential compression devices were applied to the lower extremities at the beginning of the case for DVT prophylaxis.  The patient was then placed in the dorsal lithotomy supine position, prepped and draped in sterile fashion.  We then passed the 21-French rigid cystoscope through the urethra and into the bladder under vision without any difficulty, noting a normal urethra without strictures and an enlarged prostate with median lobe.  A systematic evaluation of the bladder revealed no evidence of any suspicious bladder lesions.  Ureteral orifices were in normal position.    we then passed a 0.038 sensor wire up to the level of the renal pelvis.  The ureteral stent was then removed, leaving the sensor wire up the right ureter.    A semi-rigid ureteroscope was passed alongside the wire up the distal ureter which appeared normal. I  was able to safely advance the scope up past the UPJ and there were no ureteral stones. I double checked the ureter and again found no ureteral stones.   a gentle retrograde pyelogram was performed, revealing a normal caliber ureter without any filling defects. There was no hydronephrosis of the collecting system. The ureteroscope was removed, leaving the safety wire in place.   We then used the sensor wire and under fluoroscopic guidance and passed up a 6-French, 26 cm double-pigtail ureteral stent up ureter, making sure that the proximal and distal ends coiled within the kidney and bladder respectively.  Note that we left a short tether string attached to the distal end of the ureteral stent and it exited the urethral meatus.  The cystoscope was then advanced back into the bladder under vision.  We were able to see the distal stent coiling nicely within the bladder.  The bladder was then emptied with irrigation solution.  The cystoscope was then removed.    The patient tolerated the procedure well and there was no complication. Patient was awoken from anesthesia and taken to the recovery room in stable condition. I was present and scrubbed for the entirety of the case.  Plan:  Patient will be discharged home with instructions to remove the stent by pulling on the string in 2 days   G. Herlene Foot MD [facility]  Pager: [phone]

## 2024-01-27 NOTE — Transfer of Care (Signed)
 Immediate Anesthesia Transfer of Care Note  Patient: Jesus [name]  Procedure(s) Performed: CYSTOSCOPY/URETEROSCOPY/STENT PLACEMENT (Right: Ureter) CYSTOSCOPY, WITH RETROGRADE PYELOGRAM (Right: Ureter)  Patient Location: PACU  Anesthesia Type:General  Level of Consciousness: drowsy  Airway & Oxygen Therapy: Patient Spontanous Breathing and Patient connected to face mask oxygen  Post-op Assessment: Report given to RN and Post -op Vital signs reviewed and stable  Post vital signs: Reviewed and stable  Last Vitals:  Vitals Value Taken Time  BP 122/63 01/27/24 08:29  Temp    Pulse 67 01/27/24 08:30  Resp 14 01/27/24 08:30  SpO2 99 % 01/27/24 08:30  Vitals shown include unfiled device data.  Last Pain:  Vitals:   01/27/24 0614  TempSrc: Oral  PainSc: 0-No pain         Complications: No notable events documented.

## 2024-01-28 ENCOUNTER — Encounter (HOSPITAL_COMMUNITY): Payer: Self-pay | Admitting: Urology
